# Patient Record
Sex: Female | Born: 1965
Health system: Southern US, Community
[De-identification: ages and names within clinical notes are randomized; demographics above are authoritative.]

## PROBLEM LIST (undated history)

## (undated) DIAGNOSIS — M2550 Pain in unspecified joint: Secondary | ICD-10-CM

## (undated) DIAGNOSIS — Z91018 Allergy to other foods: Secondary | ICD-10-CM

## (undated) DIAGNOSIS — I10 Essential (primary) hypertension: Secondary | ICD-10-CM

## (undated) DIAGNOSIS — E7800 Pure hypercholesterolemia, unspecified: Secondary | ICD-10-CM

## (undated) DIAGNOSIS — K5900 Constipation, unspecified: Secondary | ICD-10-CM

## (undated) DIAGNOSIS — D649 Anemia, unspecified: Secondary | ICD-10-CM

## (undated) DIAGNOSIS — M549 Dorsalgia, unspecified: Secondary | ICD-10-CM

## (undated) DIAGNOSIS — K589 Irritable bowel syndrome without diarrhea: Secondary | ICD-10-CM

## (undated) DIAGNOSIS — K219 Gastro-esophageal reflux disease without esophagitis: Secondary | ICD-10-CM

## (undated) DIAGNOSIS — J302 Other seasonal allergic rhinitis: Secondary | ICD-10-CM

## (undated) DIAGNOSIS — E669 Obesity, unspecified: Secondary | ICD-10-CM

## (undated) HISTORY — PX: WISDOM TOOTH EXTRACTION: SHX21

## (undated) HISTORY — DX: Pain in unspecified joint: M25.50

## (undated) HISTORY — DX: Constipation, unspecified: K59.00

## (undated) HISTORY — DX: Irritable bowel syndrome, unspecified: K58.9

## (undated) HISTORY — DX: Allergy to other foods: Z91.018

## (undated) HISTORY — PX: ESOPHAGOGASTRODUODENOSCOPY: SHX1529

## (undated) HISTORY — PX: COLONOSCOPY: SHX174

## (undated) HISTORY — DX: Dorsalgia, unspecified: M54.9

## (undated) HISTORY — DX: Obesity, unspecified: E66.9

## (undated) HISTORY — DX: Pure hypercholesterolemia, unspecified: E78.00

---

## 1999-07-01 ENCOUNTER — Emergency Department (HOSPITAL_COMMUNITY): Admission: EM | Admit: 1999-07-01 | Discharge: 1999-07-01 | Payer: Self-pay | Admitting: Emergency Medicine

## 1999-07-01 ENCOUNTER — Encounter: Payer: Self-pay | Admitting: Emergency Medicine

## 2000-03-22 ENCOUNTER — Other Ambulatory Visit: Admission: RE | Admit: 2000-03-22 | Discharge: 2000-03-22 | Payer: Self-pay | Admitting: Family Medicine

## 2001-01-12 ENCOUNTER — Encounter: Payer: Self-pay | Admitting: Gastroenterology

## 2001-01-12 ENCOUNTER — Ambulatory Visit (HOSPITAL_COMMUNITY): Admission: RE | Admit: 2001-01-12 | Discharge: 2001-01-12 | Payer: Self-pay | Admitting: Gastroenterology

## 2001-06-23 ENCOUNTER — Other Ambulatory Visit: Admission: RE | Admit: 2001-06-23 | Discharge: 2001-06-23 | Payer: Self-pay | Admitting: Internal Medicine

## 2001-06-27 ENCOUNTER — Encounter: Payer: Self-pay | Admitting: Internal Medicine

## 2001-06-27 ENCOUNTER — Encounter: Admission: RE | Admit: 2001-06-27 | Discharge: 2001-06-27 | Payer: Self-pay | Admitting: Internal Medicine

## 2002-04-09 ENCOUNTER — Emergency Department (HOSPITAL_COMMUNITY): Admission: EM | Admit: 2002-04-09 | Discharge: 2002-04-09 | Payer: Self-pay

## 2002-06-28 ENCOUNTER — Other Ambulatory Visit: Admission: RE | Admit: 2002-06-28 | Discharge: 2002-06-28 | Payer: Self-pay | Admitting: Internal Medicine

## 2003-03-11 ENCOUNTER — Emergency Department (HOSPITAL_COMMUNITY): Admission: EM | Admit: 2003-03-11 | Discharge: 2003-03-11 | Payer: Self-pay | Admitting: Emergency Medicine

## 2003-07-09 ENCOUNTER — Other Ambulatory Visit: Admission: RE | Admit: 2003-07-09 | Discharge: 2003-07-09 | Payer: Self-pay | Admitting: Internal Medicine

## 2003-08-29 ENCOUNTER — Ambulatory Visit (HOSPITAL_COMMUNITY): Admission: RE | Admit: 2003-08-29 | Discharge: 2003-08-29 | Payer: Self-pay | Admitting: Gastroenterology

## 2003-08-29 ENCOUNTER — Encounter (INDEPENDENT_AMBULATORY_CARE_PROVIDER_SITE_OTHER): Payer: Self-pay

## 2003-10-29 ENCOUNTER — Ambulatory Visit (HOSPITAL_COMMUNITY): Admission: RE | Admit: 2003-10-29 | Discharge: 2003-10-29 | Payer: Self-pay | Admitting: Gastroenterology

## 2003-11-06 ENCOUNTER — Ambulatory Visit (HOSPITAL_COMMUNITY): Admission: RE | Admit: 2003-11-06 | Discharge: 2003-11-06 | Payer: Self-pay | Admitting: Gastroenterology

## 2004-02-27 ENCOUNTER — Emergency Department (HOSPITAL_COMMUNITY): Admission: EM | Admit: 2004-02-27 | Discharge: 2004-02-27 | Payer: Self-pay

## 2004-08-20 ENCOUNTER — Encounter: Admission: RE | Admit: 2004-08-20 | Discharge: 2004-08-20 | Payer: Self-pay | Admitting: Internal Medicine

## 2004-12-25 ENCOUNTER — Emergency Department (HOSPITAL_COMMUNITY): Admission: EM | Admit: 2004-12-25 | Discharge: 2004-12-25 | Payer: Self-pay | Admitting: Family Medicine

## 2005-04-06 IMAGING — US US TRANSVAGINAL NON-OB
1 series · 14 of 25 positions shown · non-contrast
Comparison: none

CLINICAL DATA: Midcycle left pelvic pain.  Possible with ovulation. 
 TRANSABDOMINAL AND TRANSVAGINAL PELVIC ULTRASOUND

[Series 1: unknown · 0.25mm/px · 14 of 44 slices shown]
[im 1/44]
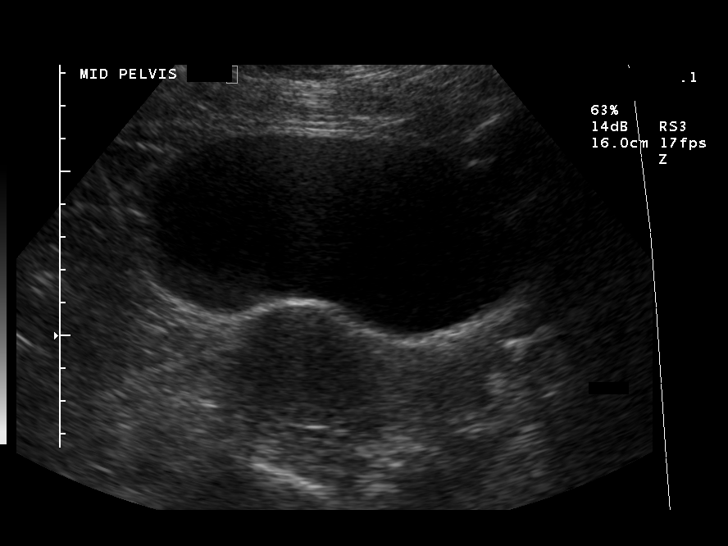
[im 4/44]
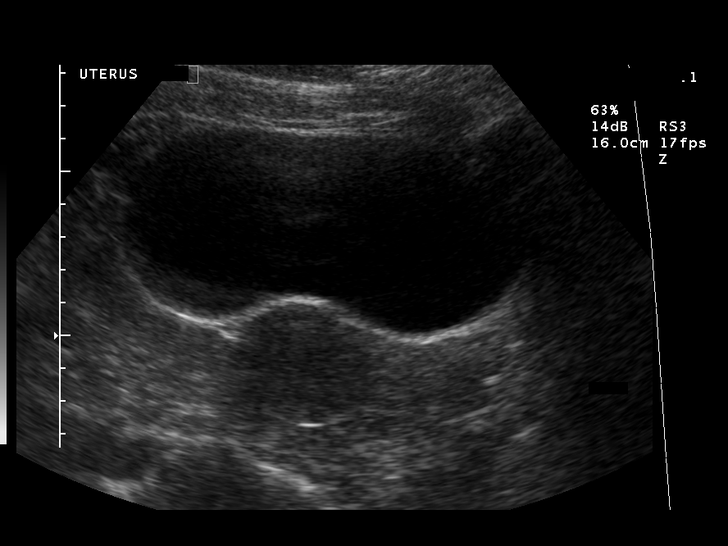
[im 8/44]
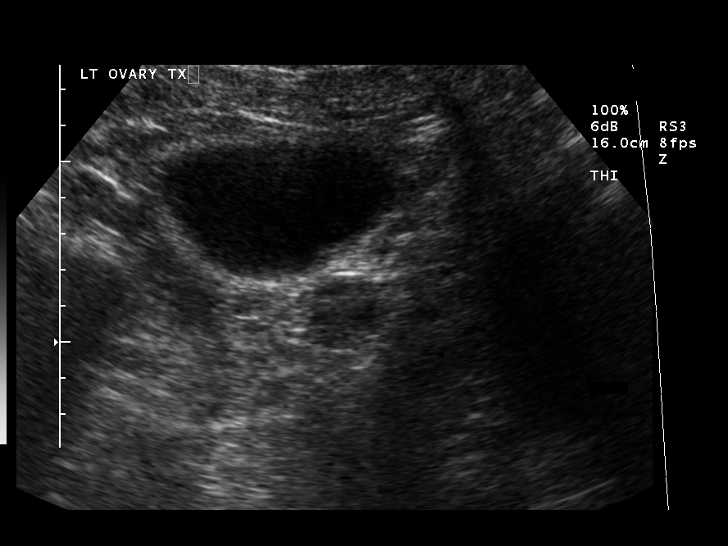
[im 11/44]
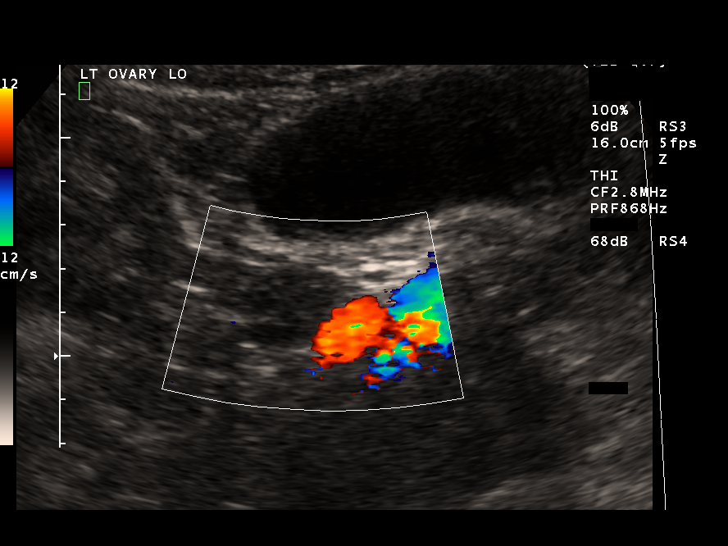
[im 15/44]
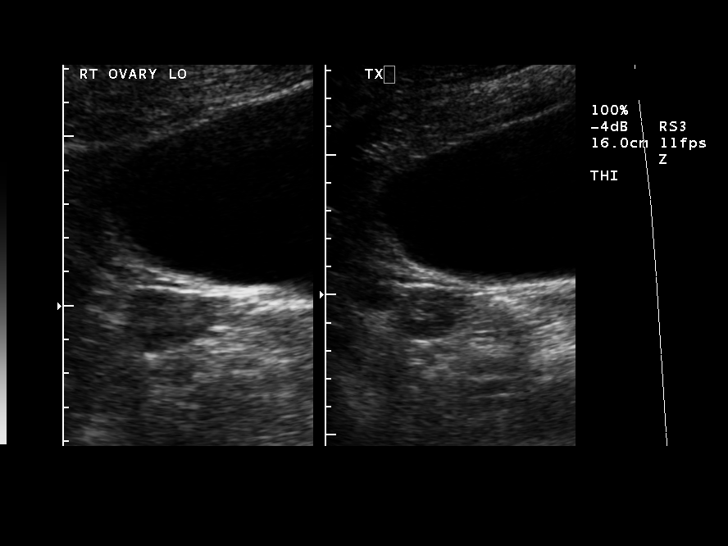
[im 17/44]
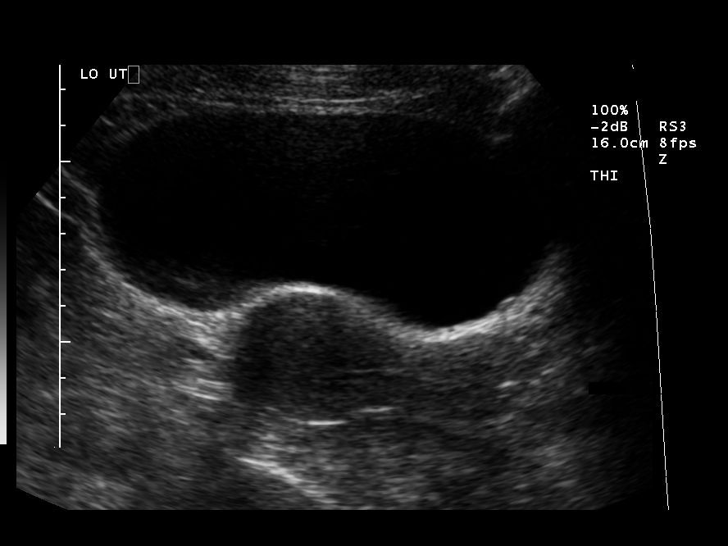
[im 20/44]
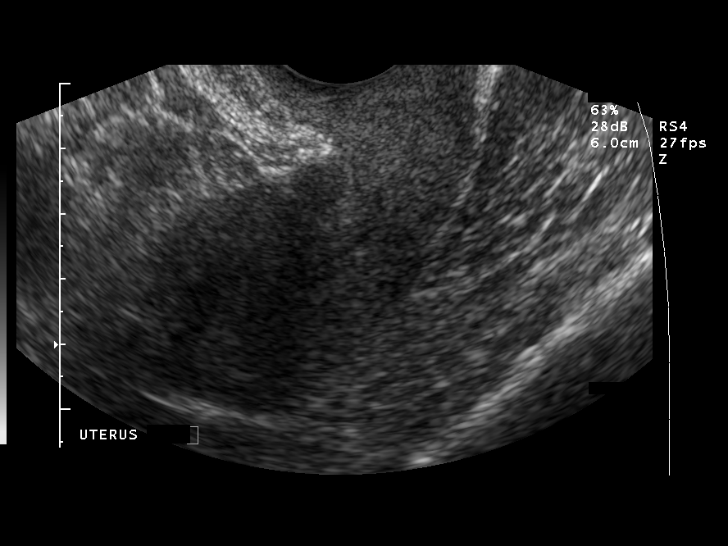
[im 24/44]
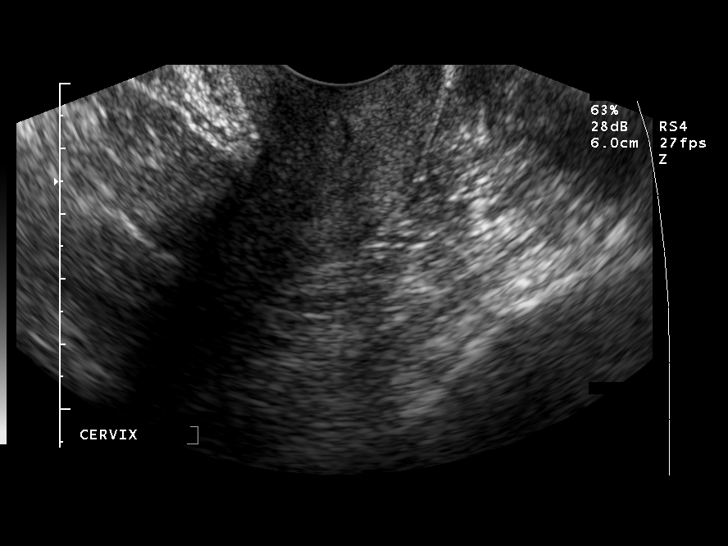
[im 27/44]
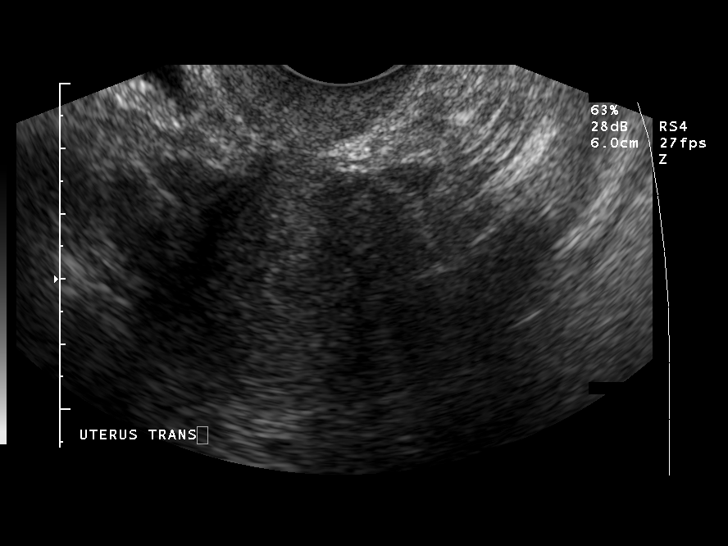
[im 29/44]
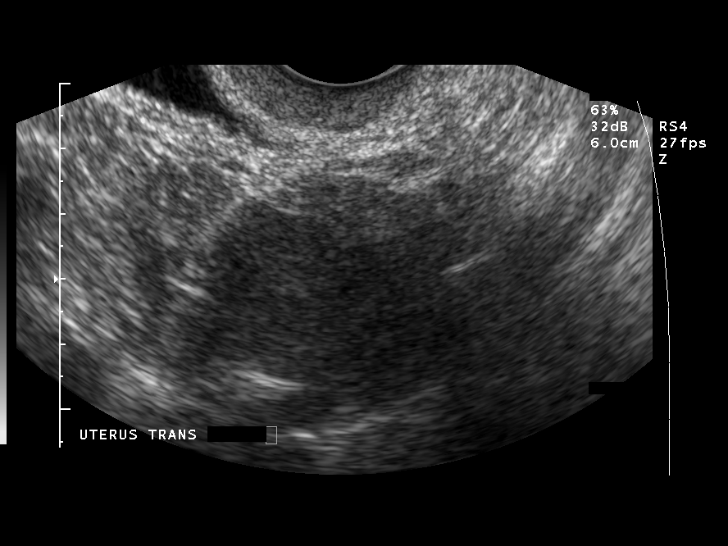
[im 33/44]
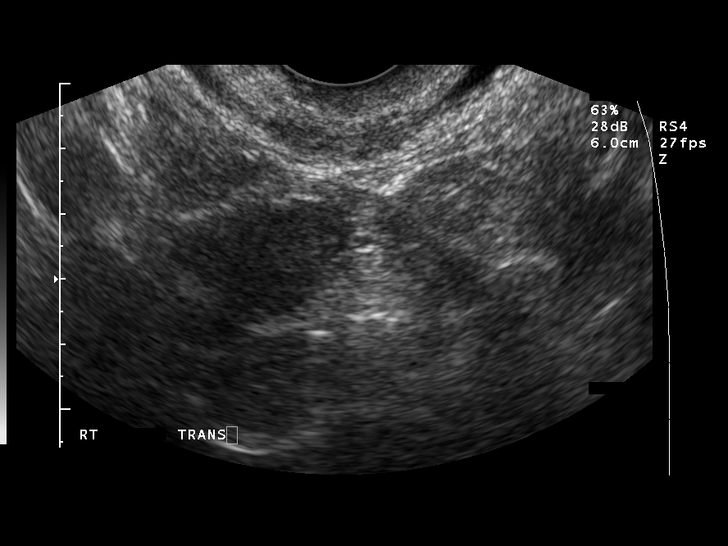
[im 36/44]
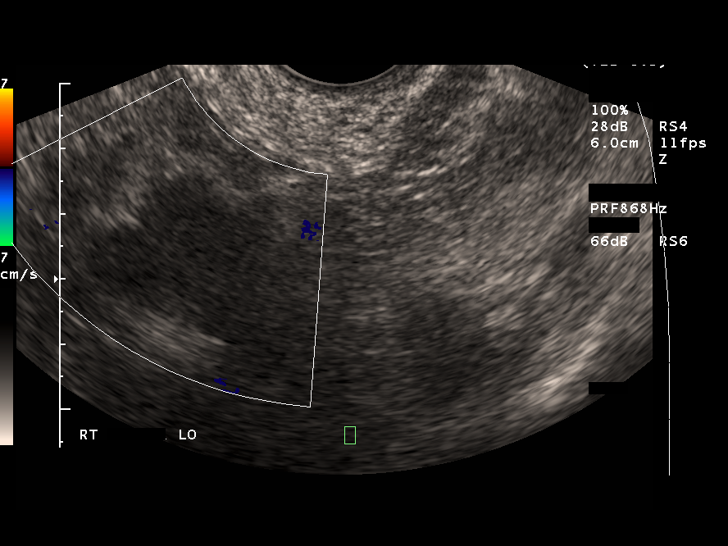
[im 40/44]
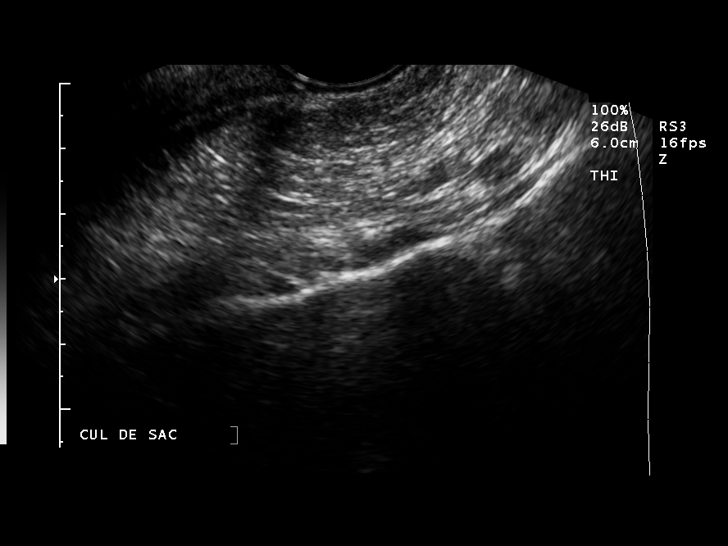
[im 44/44]
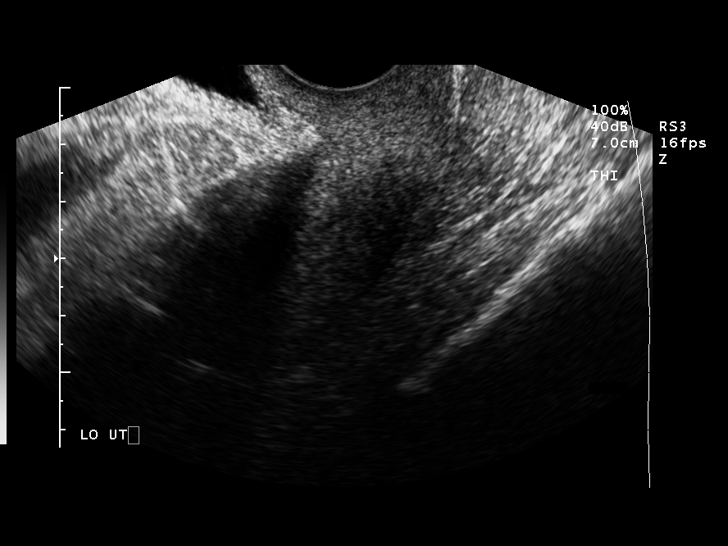

[14 of 25 positions shown; findings below may reference images not displayed]

FINDINGS: The study is slightly limited due to the patient?s body habitus and overlying gastrointestinal gas.  The uterus is normal in size measuring 8.2 cm long x 3.7 cm AP x 4.7 cm wide with slight limited assessment yet probably normal 4 mm fundal endometrial stripe thickness.  No free fluid is seen.  The left ovary is seen only transabdominally and is sonographically normal measuring 3.8 cm long x 2 cm AP x 2 cm wide.  The right ovary is normal in size also best seen transabdominally measuring 2.8 cm long x 1.8 cm AP x 2.6 cm wide with subcentimeter follicles.  
 IMPRESSION
 1.  Slight limitations due to overlying gastrointestinal gas and patient?s body habitus. 
 2.  Otherwise normal.

## 2005-09-07 ENCOUNTER — Ambulatory Visit: Payer: Self-pay | Admitting: Internal Medicine

## 2005-09-13 ENCOUNTER — Ambulatory Visit: Payer: Self-pay | Admitting: Internal Medicine

## 2005-09-14 ENCOUNTER — Other Ambulatory Visit: Admission: RE | Admit: 2005-09-14 | Discharge: 2005-09-14 | Payer: Self-pay | Admitting: Gynecology

## 2005-10-06 ENCOUNTER — Emergency Department (HOSPITAL_COMMUNITY): Admission: EM | Admit: 2005-10-06 | Discharge: 2005-10-06 | Payer: Self-pay | Admitting: Emergency Medicine

## 2006-09-12 ENCOUNTER — Ambulatory Visit: Payer: Self-pay | Admitting: Internal Medicine

## 2006-09-21 ENCOUNTER — Encounter: Payer: Self-pay | Admitting: Internal Medicine

## 2006-09-21 ENCOUNTER — Ambulatory Visit: Payer: Self-pay | Admitting: Internal Medicine

## 2006-09-21 ENCOUNTER — Other Ambulatory Visit: Admission: RE | Admit: 2006-09-21 | Discharge: 2006-09-21 | Payer: Self-pay | Admitting: Internal Medicine

## 2007-05-01 ENCOUNTER — Emergency Department (HOSPITAL_COMMUNITY): Admission: EM | Admit: 2007-05-01 | Discharge: 2007-05-01 | Payer: Self-pay | Admitting: Emergency Medicine

## 2007-11-22 ENCOUNTER — Ambulatory Visit: Payer: Self-pay | Admitting: Internal Medicine

## 2007-11-22 LAB — CONVERTED CEMR LAB
ALT: 18 units/L (ref 0–35)
AST: 19 units/L (ref 0–37)
Albumin: 3.8 g/dL (ref 3.5–5.2)
Alkaline Phosphatase: 39 units/L (ref 39–117)
BUN: 7 mg/dL (ref 6–23)
Basophils Absolute: 0 10*3/uL (ref 0.0–0.1)
Basophils Relative: 0.3 % (ref 0.0–1.0)
Bilirubin Urine: NEGATIVE
Bilirubin, Direct: 0.2 mg/dL (ref 0.0–0.3)
CO2: 27 meq/L (ref 19–32)
Calcium: 9 mg/dL (ref 8.4–10.5)
Chloride: 106 meq/L (ref 96–112)
Cholesterol: 175 mg/dL (ref 0–200)
Creatinine, Ser: 0.7 mg/dL (ref 0.4–1.2)
Crystals: NEGATIVE
Eosinophils Absolute: 0.1 10*3/uL (ref 0.0–0.6)
Eosinophils Relative: 2.3 % (ref 0.0–5.0)
GFR calc Af Amer: 117 mL/min
GFR calc non Af Amer: 97 mL/min
Glucose, Bld: 87 mg/dL (ref 70–99)
HCT: 37.7 % (ref 36.0–46.0)
HDL: 53.5 mg/dL (ref >39.0)
Hemoglobin, Urine: NEGATIVE
Hemoglobin: 12.7 g/dL (ref 12.0–15.0)
Ketones, ur: NEGATIVE mg/dL
LDL Cholesterol: 112 mg/dL — ABNORMAL HIGH (ref 0–99)
Lymphocytes Relative: 37.9 % (ref 12.0–46.0)
MCHC: 33.7 g/dL (ref 30.0–36.0)
MCV: 85.2 fL (ref 78.0–100.0)
Monocytes Absolute: 0.4 10*3/uL (ref 0.2–0.7)
Monocytes Relative: 8.5 % (ref 3.0–11.0)
Mucus, UA: NEGATIVE
Neutro Abs: 2.1 10*3/uL (ref 1.4–7.7)
Neutrophils Relative %: 51 % (ref 43.0–77.0)
Nitrite: NEGATIVE
Platelets: 182 10*3/uL (ref 150–400)
Potassium: 3.9 meq/L (ref 3.5–5.1)
RBC: 4.42 M/uL (ref 3.87–5.11)
RDW: 12.7 % (ref 11.5–14.6)
Sodium: 139 meq/L (ref 135–145)
Specific Gravity, Urine: 1.01 (ref 1.000–1.03)
TSH: 2.87 microintl units/mL (ref 0.35–5.50)
Total Bilirubin: 1 mg/dL (ref 0.3–1.2)
Total CHOL/HDL Ratio: 3.3
Total Protein, Urine: NEGATIVE mg/dL
Total Protein: 6.4 g/dL (ref 6.0–8.3)
Triglycerides: 49 mg/dL (ref 0–149)
Urine Glucose: NEGATIVE mg/dL
Urobilinogen, UA: 0.2 (ref 0.0–1.0)
VLDL: 10 mg/dL (ref 0–40)
WBC: 4.2 10*3/uL — ABNORMAL LOW (ref 4.5–10.5)
pH: 8 (ref 5.0–8.0)

## 2007-11-24 ENCOUNTER — Encounter: Payer: Self-pay | Admitting: Internal Medicine

## 2007-11-28 ENCOUNTER — Encounter: Payer: Self-pay | Admitting: Internal Medicine

## 2007-11-28 DIAGNOSIS — K589 Irritable bowel syndrome without diarrhea: Secondary | ICD-10-CM | POA: Insufficient documentation

## 2007-11-28 DIAGNOSIS — K219 Gastro-esophageal reflux disease without esophagitis: Secondary | ICD-10-CM

## 2007-11-28 DIAGNOSIS — R079 Chest pain, unspecified: Secondary | ICD-10-CM | POA: Insufficient documentation

## 2007-11-29 ENCOUNTER — Ambulatory Visit: Payer: Self-pay | Admitting: Internal Medicine

## 2008-09-11 ENCOUNTER — Encounter (INDEPENDENT_AMBULATORY_CARE_PROVIDER_SITE_OTHER): Payer: Self-pay | Admitting: *Deleted

## 2009-05-14 ENCOUNTER — Ambulatory Visit: Payer: Self-pay | Admitting: Internal Medicine

## 2009-05-14 LAB — CONVERTED CEMR LAB
ALT: 15 units/L (ref 0–35)
AST: 20 units/L (ref 0–37)
Albumin: 3.7 g/dL (ref 3.5–5.2)
Alkaline Phosphatase: 46 units/L (ref 39–117)
BUN: 12 mg/dL (ref 6–23)
Basophils Absolute: 0 10*3/uL (ref 0.0–0.1)
Basophils Relative: 0.7 % (ref 0.0–3.0)
Bilirubin Urine: NEGATIVE
Bilirubin, Direct: 0.1 mg/dL (ref 0.0–0.3)
CO2: 29 meq/L (ref 19–32)
Calcium: 8.7 mg/dL (ref 8.4–10.5)
Chloride: 110 meq/L (ref 96–112)
Cholesterol: 195 mg/dL (ref 0–200)
Creatinine, Ser: 0.7 mg/dL (ref 0.4–1.2)
Eosinophils Absolute: 0.2 10*3/uL (ref 0.0–0.7)
Eosinophils Relative: 3.6 % (ref 0.0–5.0)
GFR calc non Af Amer: 96.25 mL/min (ref >60)
Glucose, Bld: 66 mg/dL — ABNORMAL LOW (ref 70–99)
HCT: 38.5 % (ref 36.0–46.0)
HDL: 57.4 mg/dL (ref >39.00)
Hemoglobin, Urine: NEGATIVE
Hemoglobin: 12.9 g/dL (ref 12.0–15.0)
Ketones, ur: NEGATIVE mg/dL
LDL Cholesterol: 123 mg/dL — ABNORMAL HIGH (ref 0–99)
Leukocytes, UA: NEGATIVE
Lymphocytes Relative: 34 % (ref 12.0–46.0)
Lymphs Abs: 1.6 10*3/uL (ref 0.7–4.0)
MCHC: 33.5 g/dL (ref 30.0–36.0)
MCV: 86.3 fL (ref 78.0–100.0)
Monocytes Absolute: 0.4 10*3/uL (ref 0.1–1.0)
Monocytes Relative: 8 % (ref 3.0–12.0)
Neutro Abs: 2.6 10*3/uL (ref 1.4–7.7)
Neutrophils Relative %: 53.7 % (ref 43.0–77.0)
Nitrite: NEGATIVE
Platelets: 206 10*3/uL (ref 150.0–400.0)
Potassium: 4.1 meq/L (ref 3.5–5.1)
RBC: 4.46 M/uL (ref 3.87–5.11)
RDW: 13.3 % (ref 11.5–14.6)
Sodium: 143 meq/L (ref 135–145)
Specific Gravity, Urine: 1.01 (ref 1.000–1.030)
TSH: 2.47 microintl units/mL (ref 0.35–5.50)
Total Bilirubin: 0.8 mg/dL (ref 0.3–1.2)
Total CHOL/HDL Ratio: 3
Total Protein, Urine: NEGATIVE mg/dL
Total Protein: 6.5 g/dL (ref 6.0–8.3)
Triglycerides: 75 mg/dL (ref 0.0–149.0)
Urine Glucose: NEGATIVE mg/dL
Urobilinogen, UA: 0.2 (ref 0.0–1.0)
VLDL: 15 mg/dL (ref 0.0–40.0)
WBC: 4.8 10*3/uL (ref 4.5–10.5)
pH: 7.5 (ref 5.0–8.0)

## 2009-05-29 ENCOUNTER — Encounter: Payer: Self-pay | Admitting: Internal Medicine

## 2009-05-29 ENCOUNTER — Other Ambulatory Visit: Admission: RE | Admit: 2009-05-29 | Discharge: 2009-05-29 | Payer: Self-pay | Admitting: Internal Medicine

## 2009-05-29 ENCOUNTER — Ambulatory Visit: Payer: Self-pay | Admitting: Internal Medicine

## 2009-06-08 ENCOUNTER — Encounter: Payer: Self-pay | Admitting: Internal Medicine

## 2009-09-24 ENCOUNTER — Encounter: Payer: Self-pay | Admitting: Internal Medicine

## 2010-10-13 ENCOUNTER — Encounter: Payer: Self-pay | Admitting: Internal Medicine

## 2010-10-14 ENCOUNTER — Telehealth: Payer: Self-pay | Admitting: Internal Medicine

## 2011-01-19 NOTE — Progress Notes (Signed)
    Preventive Care Screening  Mammogram:    Date:  10/13/2010    Results:  normal bilateral  

## 2011-05-07 NOTE — Assessment & Plan Note (Signed)
[facility]                             PRIMARY CARE OFFICE NOTE   Allison Boyle, Allison Boyle                       MRN:          [id number]  DATE:09/21/2006                            DOB:          09/17/1966    Allison Boyle is a delightful 45 year old woman who presents for a followup  evaluation and wellness examination.  She was last seen in the office on  September 13, 2005; please see that dictation.  At that time, the patient  was having ongoing abdominal pain and pelvic pain with her menstrual cycle.  She had had a previous transvaginal ultrasound in 2005 which was  unremarkable.  The patient subsequently was seen by Dr. Nicholas Lose in late  September 2006.  He found no significant anatomic abnormality on  examination.  Ultrasound was difficult because of size and position of her  uterus.  She did have a mid-cycle CA125 which was evidently unremarkable.  The patient did have an FSH level, which was also by her report negative.  The patient reports she continues to have lower quadrant abdominal pain,  worse on the left with her menses, where she will have several bad days.  She now is taking Tylenol and using a hot water bottle with adequate relief,  although she has several bad days a month.  She continues to have some mild  menometrorrhagia.   The patient has had a significant increase in her LDL cholesterol; however,  she has been off medication and using lifestyle management.  She has been  following a low-fat diet and increasing her exercise.  She has been going to  [employer] and has had a 60-pound weight loss.   The patient has been followed by Dr. Victorino Dike for her IBS.  Fortunately,  she is currently stable.  Of note, her last upper endoscopy was in November  2004.   PAST MEDICAL HISTORY:   SURGICAL:  None.   TRAUMA:  None.   MEDICAL:  1. Usual childhood diseases.  2. Chest pain was negative for cardiac origin and workup.  3.  Reflux and IBS.  4. Menometrorrhagia with pain and discomfort.   CURRENT MEDICATIONS:  Multivitamins.  She uses over-the-counter medications  for an occasional dyspepsia.   HABITS:  Tobacco:  None, having quit many years ago.  Alcohol:  Averages 12  ounces of alcohol per weekend.   FAMILY HISTORY:  Positive for father with MI.  Mother had thyroid disease  and celiac sprue and osteoporosis.   SOCIAL HISTORY:  The patient is a native of [place].  She has been  married for 18 years.  She works in Public relations account executive at [employer].  She lives in [place].  She has 3 horses, 5 dogs, 2 cats, and  with her 2 horses being 47 years old and quite a bit of work.  She and her  husband own a piece of land.  He is engaged in rebuilding power cars.  The  patient reports that things are going well.   REVIEW OF SYSTEMS:  Negative for any constitutional problems.  It has been  more than 24 months since she has had an eye exam and does report some  decreased visual acuity in terms of focus.  No ENT, cardiovascular or  respiratory problems.  GI:  As noted above with over-the-counter medications  helping.  GU:  Stable.  MUSCULOSKELETAL:  Stable.   EXAMINATION:  Temperature was 97, blood pressure 128/82, pulse 66, weight  185, down from 243.  GENERAL APPEARANCE:  This is a well-nourished woman in no acute distress.  HEENT:  Normocephalic, atraumatic.  EACs and TMs were normal.  Oropharynx  with native dentition in good repair.  No buccal or palatal lesions were  noted.  Posterior pharynx was clear.  Conjunctivae and sclerae were clear.  PERRLA.  EOMI.  Funduscopic exam was unremarkable with no vascular  abnormality.  Disk margins were sharp; limitations were with a hand-held  instrument.  NECK:  Supple without thyromegaly.  NODES:  No adenopathy was noted in the cervical or supraclavicular regions.  CHEST:  No CVA tenderness.  Lungs were clear to auscultation and percussion.   BREASTS:  Breasts were pendulous.  Skin was normal.  The patient has  fibrocystic-type changes, no fixed mass lesion or abnormalities were noted.  There were no discharges from the nipples.  There was no axillary  adenopathy.  CARDIOVASCULAR:  Radial pulse 2+.  No JVD or carotid bruits.  She has a  quiet precordium with regular rate and rhythm without murmurs, rubs, or  gallops.  ABDOMEN:  Soft.  No guarding or rebound.  No organosplenomegaly was noted.  PELVIC:  __________ EG/BUS were normal.  There was a fair amount of mucus in  the posterior fornix.  Cervix appeared normal.  Pap scraping and  endocervical brushings were performed without difficulty.  EXTREMITIES:  Without clubbing, cyanosis, edema or deformity.  NEUROLOGIC:  Exam was nonfocal.  SKIN:  Clear.   LABORATORY DATA:  Hemoglobin was 11.2 g, white count was 4000 with a normal  differential.  Cholesterol was 176, triglycerides 84, HDL 38.2, LDL was 121.  Chemistries were unremarkable with a blood sugar of 93.  Electrolytes were  normal.  Kidney function and liver functions were normal.  TSH was normal at  2.37.  Urinalysis was normal.   ASSESSMENT AND PLAN:  1. Gynecological:  Patient with stable menometrorrhagia and dysmenorrhea.      She has been thoroughly evaluated with no underlying pathology      identified.  I plan no further workup at this time, but the patient to      continue to use symptomatic measures.  Her examination today was      unremarkable and she will be notified by phone of her Pap result.  2. Gastrointestinal:  The patient's irritable bowel syndrome and abdominal      discomfort are stable.  3. Lipids:  The patient has done a fantastic job of bringing down her LDL      cholesterol from 782 to 121 with lifestyle management alone.  I gave      her significant positive reinforcement and would have her continue her      present lifestyle management regimen. 4. Weight management:  The patient has done a  fantastic job with weight      loss, losing 60 pounds in the last 14 months.  We discussed a goal      weight for her and I have suggested a baseline body mass index table      weight of  170 pounds, which would put her at the low range of just      overweight; this would be a 15-pound weight loss in the next 12 months.      I would have her do this with her continued [employer] program.  5. Health maintenance:  The patient has been abstinent from smoking for 14      months.  I gave her significant positive reinforcement for this and in      particular, having not smoked, to continue to have lost weight is an      excellent achievement from a highly motivated patient.   The patient was asked to return to see me in 1 year or on an as-needed  basis.  I do have report of her mammograms from September 2007, which were  unremarkable.            ______________________________  Rosalyn Gess. Norins, MD      MEN/MedQ  DD:  09/21/2006  DT:  09/23/2006  Job #:  [id number]   cc:   [address] [place], [place] [address] Ms. Pearson Forster

## 2012-02-15 ENCOUNTER — Other Ambulatory Visit: Payer: Self-pay | Admitting: Family Medicine

## 2012-02-15 ENCOUNTER — Other Ambulatory Visit (HOSPITAL_COMMUNITY)
Admission: RE | Admit: 2012-02-15 | Discharge: 2012-02-15 | Disposition: A | Discharge status: Home / Self Care | Payer: 59 | Source: Ambulatory Visit | Attending: Family Medicine | Admitting: Family Medicine

## 2012-02-15 DIAGNOSIS — Z124 Encounter for screening for malignant neoplasm of cervix: Secondary | ICD-10-CM | POA: Insufficient documentation

## 2014-09-24 ENCOUNTER — Other Ambulatory Visit: Payer: Self-pay | Admitting: Gastroenterology

## 2014-12-09 ENCOUNTER — Ambulatory Visit (HOSPITAL_COMMUNITY)
Admission: RE | Admit: 2014-12-09 | Discharge: 2014-12-09 | Disposition: A | Discharge status: Home / Self Care | Payer: 59 | Source: Ambulatory Visit | Attending: Gastroenterology | Admitting: Gastroenterology

## 2014-12-09 ENCOUNTER — Other Ambulatory Visit (HOSPITAL_COMMUNITY): Payer: Self-pay | Admitting: Gastroenterology

## 2014-12-09 DIAGNOSIS — D649 Anemia, unspecified: Secondary | ICD-10-CM | POA: Diagnosis present

## 2014-12-09 DIAGNOSIS — I610 Nontraumatic intracerebral hemorrhage in hemisphere, subcortical: Secondary | ICD-10-CM

## 2014-12-09 DIAGNOSIS — K922 Gastrointestinal hemorrhage, unspecified: Secondary | ICD-10-CM | POA: Insufficient documentation

## 2015-03-04 ENCOUNTER — Other Ambulatory Visit (HOSPITAL_COMMUNITY)
Admission: RE | Admit: 2015-03-04 | Discharge: 2015-03-04 | Disposition: A | Discharge status: Home / Self Care | Payer: 59 | Source: Ambulatory Visit | Attending: Family Medicine | Admitting: Family Medicine

## 2015-03-04 ENCOUNTER — Other Ambulatory Visit: Payer: Self-pay | Admitting: Family Medicine

## 2015-03-04 DIAGNOSIS — Z01419 Encounter for gynecological examination (general) (routine) without abnormal findings: Secondary | ICD-10-CM | POA: Diagnosis not present

## 2015-03-06 LAB — CYTOLOGY - PAP

## 2015-03-20 ENCOUNTER — Other Ambulatory Visit: Payer: Self-pay | Admitting: Nurse Practitioner

## 2015-04-08 NOTE — Patient Instructions (Addendum)
   Your procedure is scheduled on: Thursday, April 28  Enter through the [employer] [facility] at: [place] up the phone at the desk and dial [phone] and inform us of your arrival.  Please call this number if you have any problems the morning of surgery: [phone]  Remember: Do not eat food after midnight: Wednesday Do not drink clear liquids after: 8:30 AM Thursday, day of surgery Take these medicines the morning of surgery with a SIP OF WATER:  Protonix and hctz   Do not wear jewelry, make-up, or FINGER nail polish No metal in your hair or on your body. Do not wear lotions, powders, perfumes.  You may wear deodorant.  Do not bring valuables to the hospital. Contacts, dentures or bridgework may not be worn into surgery.  Patients discharged on the day of surgery will not be allowed to drive home.  Home with husband Remo Lipps cell [phone]

## 2015-04-08 NOTE — H&P (Signed)
H&P 49yo G0 who presents for Hysteroscopy, D&C, HTA ablation due to heavy menstrual bleeding.  In review,  She reports heavy menstrual bleeding with menses every month lasting 5-7 days with 2-4 days of heavy bleeding. During the heavy days she will have to use both a super tampon and pad and change it every 2 hours. She also reports passage of large clots, sometimes plum-sized or bigger. +Dysmenorrhea and bloating during her period. No intermenstrual bleeding. Of note, she has significant anemia due to her bleeding with low iron studies and Hgb 9.8.  An EMB and TVUS were performed with benign findings (see below).  Management options were reviewed and pt wishes to proceed with surgical management.      ROS:  CONSTITUTIONAL:  no Chills. no Fever. no Skin rash.  HEENT:  Blurrred vision no.  CARDIOLOGY:  no Chest pain.  RESPIRATORY:  no Shortness of breath. no Cough.  GASTROENTEROLOGY:  no Abdominal pain. no Appetite change. no Change in bowel movements.  UROLOGY:  no Urinary frequency. no Urinary incontinence. no Urinary urgency.  FEMALE REPRODUCTIVE:  no Breast lumps or discharge. no Breast pain.  NEUROLOGY:  no Dizziness. no Headache.         Medical History: Hypertension, GERD, Heavy menstrual bleeding, h/o MVP diagnosis, later cleared with echo - no need for dental prophylaxis, chronic iron deficiency anemia (probably menstrual); Hemoccult negative, EGD/colonoscopy normal 09/2014; capsule endoscopy nl 11/2014, Posion ivy - hives, Food allergies-hives, Anemia.        Gyn History:  Sexual activity currently sexually active.  Periods : every 28 days, regular.  LMP 03/05/2015.  Birth control condoms.  Menarche 11.        OB History:  Never been pregnant per patient.        Surgical History: wisdom teeth extraction , colonoscopy 09/24/2014, endo 09/24/2014, capsule endo 11/25/2014 .        Hospitalization/Major Diagnostic Procedure: Broken Pelvic 07/1989's.        Family  History: Father: deceased, CAD, CVA, diagnosed with HTN, CVA, CADMother: alive, osteoporosis, goiter, h/o oophorectomy, diagnosed with HTNPaternal Grand Father: deceased, MIPaternal Grand Mother: deceasedMaternal [place] Father: deceasedMaternal Grand Mother: deceased, diagnosed with CVABrother 1: alive, MISister 1: alive, goiterPaternal aunt: deceased, Kidney problemsMaternal aunt: deceased, Kidney problems Neg family history for colon cancer/polyps and liver Dz.       Social History:  General:  Tobacco use  cigarettes: Former smoker Quit in year 2005 Tobacco history last updated 03/20/2015 no Smoking, in the past.  Alcohol: yes, beer, occasionally.  Caffeine: yes, very little.  no Recreational drug use.  Occupation: employed, Doctor, general practice.  Marital Status: married.  Children: none.        Medications: Taking Multivitamin . Tablet 1 tablet Once daily, Taking Poly Iron PN(Prenatal Vit-Fe Psac Cmplx-FA) 150 mg 1 capsule once a day, Taking Protonix(Pantoprazole Sodium) 40 MG Tablet Delayed Release 1 tablet as needed, Taking Fish Oil 1000 MG Capsule 1 capsule with a meal Once a day, Taking Hydrochlorothiazide 12.5 MG Tablet 1 tablet Once a day, Medication List reviewed and reconciled with the patient       Allergies: Polyethylene Glycol: rash: Allergy, strawberries: severe hives: Allergy.    O:  GENERAL APPEARANCE alert, oriented, NAD, pleasant.  SKIN: normal, no rash.  LUNGS: clear to auscultation bilaterally, no wheezes, rhonchi, rales.  HEART: no murmurs, regular rate and rhythm.  ABDOMEN: obese, no masses palpated- possible enlarged uterus, but difficult to palpate due to obesity, soft and not tender,  no rebound, no guarding.  FEMALE GENITOURINARY: No external lesions, Vagina - pink moist mucosa, no lesions or abnormal discharge, cervix - closed, no discharge or lesions. No CMT. No adnexal masses bilaterally. Uterus: nontender and normal size on palpation.  EXTREMITIES: no edema  present, no calf tenderness bilaterally.    Labs:  EMB (03/20/15): small fragments of benign endometrial polyp, nonpolypoid proliferative endometrium, no atypia or malignancy  TVUS (03/26/15): Normal sized anteverted uterus measuring 7.4x4.7x3.9cm, no uterine anomalies. Thickened endometrium, no discrete mass seen. Right ovary with simple follicle <3ZS and left ovary within normal limits.    A/P: 49yo female who presents for hysteroscopy, D&C, HTA ablation due to heavy menstrual bleeding -NPO -LR @ 125cc/hr -SCDs to OR -Risk, benefit and indication reviewed with patient including risk of bleeding, cramping and potential failure of the procedure.  Questions and concerns were addressed and pt wishes to proceed with procedure.  [name], DO [phone] (pager) [phone] (office)

## 2015-04-09 ENCOUNTER — Encounter (HOSPITAL_COMMUNITY)
Admission: RE | Admit: 2015-04-09 | Discharge: 2015-04-09 | Disposition: A | Discharge status: Home / Self Care | Payer: 59 | Source: Ambulatory Visit | Attending: Obstetrics & Gynecology | Admitting: Obstetrics & Gynecology

## 2015-04-09 ENCOUNTER — Encounter (HOSPITAL_COMMUNITY): Payer: Self-pay

## 2015-04-09 ENCOUNTER — Other Ambulatory Visit: Payer: Self-pay

## 2015-04-09 DIAGNOSIS — Z01818 Encounter for other preprocedural examination: Secondary | ICD-10-CM | POA: Insufficient documentation

## 2015-04-09 HISTORY — DX: Other seasonal allergic rhinitis: J30.2

## 2015-04-09 HISTORY — DX: Gastro-esophageal reflux disease without esophagitis: K21.9

## 2015-04-09 HISTORY — DX: Essential (primary) hypertension: I10

## 2015-04-09 HISTORY — DX: Anemia, unspecified: D64.9

## 2015-04-09 LAB — BASIC METABOLIC PANEL
ANION GAP: 7 (ref 5–15)
BUN: 16 mg/dL (ref 6–23)
CO2: 25 mmol/L (ref 19–32)
Calcium: 8.2 mg/dL — ABNORMAL LOW (ref 8.4–10.5)
Chloride: 104 mmol/L (ref 96–112)
Creatinine, Ser: 0.76 mg/dL (ref 0.50–1.10)
GFR calc Af Amer: >90 mL/min (ref >90)
GFR calc non Af Amer: >90 mL/min (ref >90)
Glucose, Bld: 138 mg/dL — ABNORMAL HIGH (ref 70–99)
Potassium: 3.3 mmol/L — ABNORMAL LOW (ref 3.5–5.1)
Sodium: 136 mmol/L (ref 135–145)

## 2015-04-09 LAB — CBC WITH DIFFERENTIAL/PLATELET
Basophils Absolute: 0 10*3/uL (ref 0.0–0.1)
Basophils Relative: 1 % (ref 0–1)
Eosinophils Absolute: 0.3 10*3/uL (ref 0.0–0.7)
Eosinophils Relative: 5 % (ref 0–5)
HCT: 33.1 % — ABNORMAL LOW (ref 36.0–46.0)
Hemoglobin: 10 g/dL — ABNORMAL LOW (ref 12.0–15.0)
Lymphocytes Relative: 30 % (ref 12–46)
Lymphs Abs: 1.8 10*3/uL (ref 0.7–4.0)
MCH: 22.9 pg — ABNORMAL LOW (ref 26.0–34.0)
MCHC: 30.2 g/dL (ref 30.0–36.0)
MCV: 75.7 fL — ABNORMAL LOW (ref 78.0–100.0)
Monocytes Absolute: 0.4 10*3/uL (ref 0.1–1.0)
Monocytes Relative: 6 % (ref 3–12)
Neutro Abs: 3.5 10*3/uL (ref 1.7–7.7)
Neutrophils Relative %: 58 % (ref 43–77)
Platelets: 243 10*3/uL (ref 150–400)
RBC: 4.37 MIL/uL (ref 3.87–5.11)
RDW: 18.3 % — AB (ref 11.5–15.5)
WBC: 6.1 10*3/uL (ref 4.0–10.5)

## 2015-04-16 NOTE — Anesthesia Preprocedure Evaluation (Addendum)
Anesthesia Evaluation  Patient identified by MRN, date of birth, ID band Patient awake    Reviewed: Allergy & Precautions, NPO status , Patient's Chart, lab work & pertinent test results  History of Anesthesia Complications Negative for: history of anesthetic complications  Airway Mallampati: II  TM Distance: >3 FB Neck ROM: Full    Dental no notable dental hx. (+) Dental Advisory Given   Pulmonary former smoker,  breath sounds clear to auscultation  Pulmonary exam normal       Cardiovascular hypertension, Pt. on medications Rhythm:Regular Rate:Normal     Neuro/Psych negative neurological ROS  negative psych ROS   GI/Hepatic Neg liver ROS, GERD-  Medicated and Controlled,IBS   Endo/Other  Morbid obesity  Renal/GU negative Renal ROS  negative genitourinary   Musculoskeletal negative musculoskeletal ROS (+)   Abdominal (+) + obese,   Peds negative pediatric ROS (+)  Hematology  (+) anemia ,   Anesthesia Other Findings   Reproductive/Obstetrics negative OB ROS                           Anesthesia Physical Anesthesia Plan  ASA: III  Anesthesia Plan: General   Post-op Pain Management:    Induction: Intravenous  Airway Management Planned: LMA  Additional Equipment:   Intra-op Plan:   Post-operative Plan: Extubation in OR  Informed Consent: I have reviewed the patients History and Physical, chart, labs and discussed the procedure including the risks, benefits and alternatives for the proposed anesthesia with the patient or authorized representative who has indicated his/her understanding and acceptance.   Dental advisory given  Plan Discussed with: CRNA, Anesthesiologist and Surgeon  Anesthesia Plan Comments:        Anesthesia Quick Evaluation

## 2015-04-17 ENCOUNTER — Ambulatory Visit (HOSPITAL_COMMUNITY): Payer: 59 | Admitting: Anesthesiology

## 2015-04-17 ENCOUNTER — Encounter (HOSPITAL_COMMUNITY)
Admission: RE | Disposition: A | Discharge status: Home / Self Care | Payer: Self-pay | Source: Ambulatory Visit | Attending: Obstetrics & Gynecology

## 2015-04-17 ENCOUNTER — Ambulatory Visit (HOSPITAL_COMMUNITY)
Admission: RE | Admit: 2015-04-17 | Discharge: 2015-04-17 | Disposition: A | Discharge status: Home / Self Care | Payer: 59 | Source: Ambulatory Visit | Attending: Obstetrics & Gynecology | Admitting: Obstetrics & Gynecology

## 2015-04-17 ENCOUNTER — Encounter (HOSPITAL_COMMUNITY): Payer: Self-pay | Admitting: Anesthesiology

## 2015-04-17 DIAGNOSIS — Z6841 Body Mass Index (BMI) 40.0 and over, adult: Secondary | ICD-10-CM | POA: Insufficient documentation

## 2015-04-17 DIAGNOSIS — D649 Anemia, unspecified: Secondary | ICD-10-CM | POA: Insufficient documentation

## 2015-04-17 DIAGNOSIS — I10 Essential (primary) hypertension: Secondary | ICD-10-CM | POA: Insufficient documentation

## 2015-04-17 DIAGNOSIS — K219 Gastro-esophageal reflux disease without esophagitis: Secondary | ICD-10-CM | POA: Insufficient documentation

## 2015-04-17 DIAGNOSIS — Z87891 Personal history of nicotine dependence: Secondary | ICD-10-CM | POA: Diagnosis not present

## 2015-04-17 DIAGNOSIS — I341 Nonrheumatic mitral (valve) prolapse: Secondary | ICD-10-CM | POA: Insufficient documentation

## 2015-04-17 DIAGNOSIS — N939 Abnormal uterine and vaginal bleeding, unspecified: Secondary | ICD-10-CM | POA: Insufficient documentation

## 2015-04-17 DIAGNOSIS — Z91018 Allergy to other foods: Secondary | ICD-10-CM | POA: Diagnosis not present

## 2015-04-17 HISTORY — PX: DILITATION & CURRETTAGE/HYSTROSCOPY WITH HYDROTHERMAL ABLATION: SHX5570

## 2015-04-17 LAB — PREGNANCY, URINE: PREG TEST UR: NEGATIVE

## 2015-04-17 SURGERY — DILATATION & CURETTAGE/HYSTEROSCOPY WITH HYDROTHERMAL ABLATION
Anesthesia: General | Site: Vagina

## 2015-04-17 MED ORDER — ONDANSETRON HCL 4 MG/2ML IJ SOLN: 4.0000 mg | Freq: Once | INTRAMUSCULAR | Status: DC | PRN

## 2015-04-17 MED ORDER — HYDROMORPHONE HCL 1 MG/ML IJ SOLN
INTRAMUSCULAR | Status: DC | PRN
Start: 2015-04-17 — End: 2015-04-17
  Administered 2015-04-17: 0.5 mg via INTRAVENOUS

## 2015-04-17 MED ORDER — FENTANYL CITRATE (PF) 100 MCG/2ML IJ SOLN: 25.0000 ug | INTRAMUSCULAR | Status: DC | PRN

## 2015-04-17 MED ORDER — FENTANYL CITRATE (PF) 100 MCG/2ML IJ SOLN
INTRAMUSCULAR | Status: DC | PRN
  Administered 2015-04-17: 50 ug via INTRAVENOUS
  Administered 2015-04-17: 100 ug via INTRAVENOUS
  Administered 2015-04-17: 50 ug via INTRAVENOUS

## 2015-04-17 MED ORDER — DEXAMETHASONE SODIUM PHOSPHATE 4 MG/ML IJ SOLN
INTRAMUSCULAR | Status: AC
  Filled 2015-04-17: qty 1

## 2015-04-17 MED ORDER — SCOPOLAMINE 1 MG/3DAYS TD PT72
1.0000 | MEDICATED_PATCH | Freq: Once | TRANSDERMAL | Status: DC
  Administered 2015-04-17: 1.5 mg via TRANSDERMAL

## 2015-04-17 MED ORDER — ACETAMINOPHEN 500 MG PO TABS
ORAL_TABLET | ORAL | Status: AC
  Filled 2015-04-17: qty 2

## 2015-04-17 MED ORDER — MIDAZOLAM HCL 2 MG/2ML IJ SOLN
INTRAMUSCULAR | Status: DC | PRN
  Administered 2015-04-17: 2 mg via INTRAVENOUS

## 2015-04-17 MED ORDER — KETOROLAC TROMETHAMINE 30 MG/ML IJ SOLN
INTRAMUSCULAR | Status: DC | PRN
  Administered 2015-04-17: 30 mg via INTRAVENOUS

## 2015-04-17 MED ORDER — KETOROLAC TROMETHAMINE 30 MG/ML IJ SOLN
INTRAMUSCULAR | Status: AC
  Filled 2015-04-17: qty 1

## 2015-04-17 MED ORDER — SCOPOLAMINE 1 MG/3DAYS TD PT72
MEDICATED_PATCH | TRANSDERMAL | Status: AC
  Administered 2015-04-17: 1.5 mg via TRANSDERMAL
  Filled 2015-04-17: qty 1

## 2015-04-17 MED ORDER — LIDOCAINE HCL (CARDIAC) 20 MG/ML IV SOLN
INTRAVENOUS | Status: DC | PRN
  Administered 2015-04-17: 80 mg via INTRAVENOUS

## 2015-04-17 MED ORDER — GLYCOPYRROLATE 0.2 MG/ML IJ SOLN
INTRAMUSCULAR | Status: DC | PRN
  Administered 2015-04-17: 0.1 mg via INTRAVENOUS

## 2015-04-17 MED ORDER — FENTANYL CITRATE (PF) 100 MCG/2ML IJ SOLN
INTRAMUSCULAR | Status: AC
  Filled 2015-04-17: qty 2

## 2015-04-17 MED ORDER — HYDROMORPHONE HCL 1 MG/ML IJ SOLN
INTRAMUSCULAR | Status: AC
  Filled 2015-04-17: qty 1

## 2015-04-17 MED ORDER — PROPOFOL 10 MG/ML IV BOLUS
INTRAVENOUS | Status: DC | PRN
  Administered 2015-04-17: 20 mg via INTRAVENOUS
  Administered 2015-04-17: 150 mg via INTRAVENOUS

## 2015-04-17 MED ORDER — SCOPOLAMINE 1 MG/3DAYS TD PT72
MEDICATED_PATCH | TRANSDERMAL | Status: AC
  Filled 2015-04-17: qty 1

## 2015-04-17 MED ORDER — MIDAZOLAM HCL 2 MG/2ML IJ SOLN
INTRAMUSCULAR | Status: AC
  Filled 2015-04-17: qty 2

## 2015-04-17 MED ORDER — LACTATED RINGERS IV SOLN: INTRAVENOUS | Status: DC

## 2015-04-17 MED ORDER — LACTATED RINGERS IV SOLN
INTRAVENOUS | Status: DC
  Administered 2015-04-17 (×2): via INTRAVENOUS

## 2015-04-17 MED ORDER — ACETAMINOPHEN 500 MG PO TABS
1000.0000 mg | ORAL_TABLET | Freq: Once | ORAL | Status: AC
  Administered 2015-04-17: 1000 mg via ORAL

## 2015-04-17 MED ORDER — DEXAMETHASONE SODIUM PHOSPHATE 10 MG/ML IJ SOLN
INTRAMUSCULAR | Status: DC | PRN
  Administered 2015-04-17: 4 mg via INTRAVENOUS

## 2015-04-17 MED ORDER — ONDANSETRON HCL 4 MG/2ML IJ SOLN
INTRAMUSCULAR | Status: AC
  Filled 2015-04-17: qty 2

## 2015-04-17 MED ORDER — ONDANSETRON HCL 4 MG/2ML IJ SOLN
INTRAMUSCULAR | Status: DC | PRN
  Administered 2015-04-17: 4 mg via INTRAVENOUS

## 2015-04-17 SURGICAL SUPPLY — 13 items
CANISTER SUCT 3000ML (MISCELLANEOUS) ×3 IMPLANT
CATH ROBINSON RED A/P 16FR (CATHETERS) ×3 IMPLANT
CLOTH BEACON ORANGE TIMEOUT ST (SAFETY) ×3 IMPLANT
CONTAINER PREFILL 10% NBF 60ML (FORM) ×6 IMPLANT
DILATOR CANAL MILEX (MISCELLANEOUS) IMPLANT
GLOVE BIOGEL PI IND STRL 6.5 (GLOVE) ×2 IMPLANT
GLOVE BIOGEL PI INDICATOR 6.5 (GLOVE) ×4
GLOVE ECLIPSE 6.5 STRL STRAW (GLOVE) ×3 IMPLANT
GOWN STRL REUS W/TWL LRG LVL3 (GOWN DISPOSABLE) ×6 IMPLANT
PACK VAGINAL MINOR WOMEN LF (CUSTOM PROCEDURE TRAY) ×3 IMPLANT
PAD OB MATERNITY 4.3X12.25 (PERSONAL CARE ITEMS) ×3 IMPLANT
SET GENESYS HTA PROCERVA (MISCELLANEOUS) ×3 IMPLANT
TOWEL OR 17X24 6PK STRL BLUE (TOWEL DISPOSABLE) ×6 IMPLANT

## 2015-04-17 NOTE — Op Note (Signed)
Operative Report  PreOp: Abnormal uterine bleeding PostOp: same Procedure:  Hysteroscopy, Dilation and Curettage, Endometrial ablation Surgeon: Dr. [name] Anesthesia: General Complications:none EBL: 5cc UOP: 30cc IVF:1000cc  Findings:9wk sized anteverted uterus with proliferative polypoid-like endometrial tissue, ostia visualized bilaterally  Specimens: 1) endometrial curettings  Procedure: The patient was taken to the operating room where she underwent general anesthesia without difficulty. The patient was placed in a low lithotomy position using Allen stirrups. She was prepped and draped in the normal sterile fashion. The bladder was drained using a red rubber urethral catheter. A sterile speculum was inserted into the vagina. A single tooth tenaculum was placed on the anterior lip of the cervix. The uterus was then sounded to 9cm. The endocervical canal was then serially dilated to 18French using Hank dilators.  The diagnostic hysteroscope was then inserted without difficulty and noted to have the findings as listed above. Visualization was achieved using NS as a distending medium. The HTA system was then activated using manufacturer's protocol and utilized without difficulty or complications.  Visualization was maintained throughout the procedure.  The device was then removed and sharp curettage was performed.  The hysteroscope was reinserted- global ablation without perforation was noted.  All instrument were then removed. Hemostasis was observed at the cervical site. The patient was repositioned to the supine position. The patient tolerated the procedure without any complications and taken to recovery in stable condition.   [name], DO [phone] (pager) [phone] (office)

## 2015-04-17 NOTE — Discharge Instructions (Signed)
HOME INSTRUCTIONS  Please note any unusual or excessive bleeding, pain, swelling. Mild dizziness or drowsiness are normal for about 24 hours after surgery.   Shower when comfortable  Restrictions: No driving for 24 hours or while taking pain medications.  Activity:  No heavy lifting (> 10 lbs), nothing in vagina (no tampons, douching, or intercourse) x 2 weeks; no tub baths for 2 weeks Vaginal spotting is expected but if your bleeding is heavy, period like,  please call the office  Diet:  You may eat whatever you want.  Do not eat large meals.  Eat small frequent meals throughout the day.  Continue to drink a good amount of water at least 6-8 glasses of water per day, hydration is very important for the healing process.  Pain Management: Take Motrin and/or Tylenol as prescribed/needed for pain.  Always take prescription pain medication with food, it may cause constipation, increase fluids and fiber and you may want to take an over-the-counter stool softener like Colace as needed up to 2x a day.    Alcohol -- Avoid for 24 hours and while taking pain medications.  Nausea: Take sips of ginger ale or soda  Fever -- Call physician if temperature over 101 degrees  Follow up:  If you do not already have a follow up appointment scheduled, please call the office at [phone].  If you experience fever (a temperature greater than 100.4), pain unrelieved by pain medication, shortness of breath, swelling of a single leg, or any other symptoms which are concerning to you please the office immediately.

## 2015-04-17 NOTE — Interval H&P Note (Signed)
History and Physical Interval Note:  04/17/2015 12:07 PM  Allison Boyle  has presented today for surgery, with the diagnosis of N93.9  Abnormal Uterine Bleeding  The various methods of treatment have been discussed with the patient and family. After consideration of risks, benefits and other options for treatment, the patient has consented to  Procedure(s): DILATATION & CURETTAGE/HYSTEROSCOPY WITH HYDROTHERMAL ABLATION (N/A) as a surgical intervention .  The patient's history has been reviewed, patient examined, no change in status, stable for surgery.  I have reviewed the patient's chart and labs.  Questions were answered to the patient's satisfaction.     [name], M

## 2015-04-17 NOTE — Anesthesia Procedure Notes (Signed)
Procedure Name: LMA Insertion Date/Time: 04/17/2015 12:23 PM Performed by: Flossie Dibble Pre-anesthesia Checklist: Patient identified, Patient being monitored, Timeout performed, Emergency Drugs available and Suction available Patient Re-evaluated:Patient Re-evaluated prior to inductionOxygen Delivery Method: Circle system utilized Preoxygenation: Pre-oxygenation with 100% oxygen Intubation Type: IV induction LMA: LMA inserted LMA Size: 4.0 Number of attempts: 1 Placement Confirmation: breath sounds checked- equal and bilateral and positive ETCO2 Tube secured with: Tape Dental Injury: Teeth and Oropharynx as per pre-operative assessment

## 2015-04-17 NOTE — Anesthesia Postprocedure Evaluation (Signed)
  Anesthesia Post-op Note  Patient: Allison Boyle  Procedure(s) Performed: Procedure(s): DILATATION & CURETTAGE/HYSTEROSCOPY WITH HYDROTHERMAL ABLATION (N/A)  Patient Location: PACU  Anesthesia Type:General  Level of Consciousness: awake, alert  and oriented  Airway and Oxygen Therapy: Patient Spontanous Breathing  Post-op Pain: mild  Post-op Assessment: Post-op Vital signs reviewed, Patient's Cardiovascular Status Stable, Respiratory Function Stable, Patent Airway, No signs of Nausea or vomiting and Pain level controlled  Post-op Vital Signs: Reviewed and stable  Last Vitals:  Filed Vitals:   04/17/15 1415  BP:   Pulse: 63  Temp: 36.4 C  Resp: 16    Complications: No apparent anesthesia complications

## 2015-04-17 NOTE — Transfer of Care (Signed)
Immediate Anesthesia Transfer of Care Note  Patient: Allison Boyle  Procedure(s) Performed: Procedure(s): DILATATION & CURETTAGE/HYSTEROSCOPY WITH HYDROTHERMAL ABLATION (N/A)  Patient Location: PACU  Anesthesia Type:General  Level of Consciousness: awake, alert  and oriented  Airway & Oxygen Therapy: Patient Spontanous Breathing and Patient connected to nasal cannula oxygen  Post-op Assessment: Report given to RN and Post -op Vital signs reviewed and stable  Post vital signs: Reviewed and stable  Last Vitals:  Filed Vitals:   04/17/15 1109  BP: 136/80  Pulse: 88  Temp: 36.3 C  Resp: 18    Complications: No apparent anesthesia complications

## 2015-04-19 ENCOUNTER — Encounter (HOSPITAL_COMMUNITY): Payer: Self-pay | Admitting: Obstetrics & Gynecology

## 2015-07-26 IMAGING — DX DG ABDOMEN 1V
1 series · 1 of 1 positions shown · non-contrast
Comparison: CT with 11/06/2003

CLINICAL DATA: Anemia.  Gastrointestinal bleeding.

EXAM:
ABDOMEN - 1 VIEW

[abdomen supine]
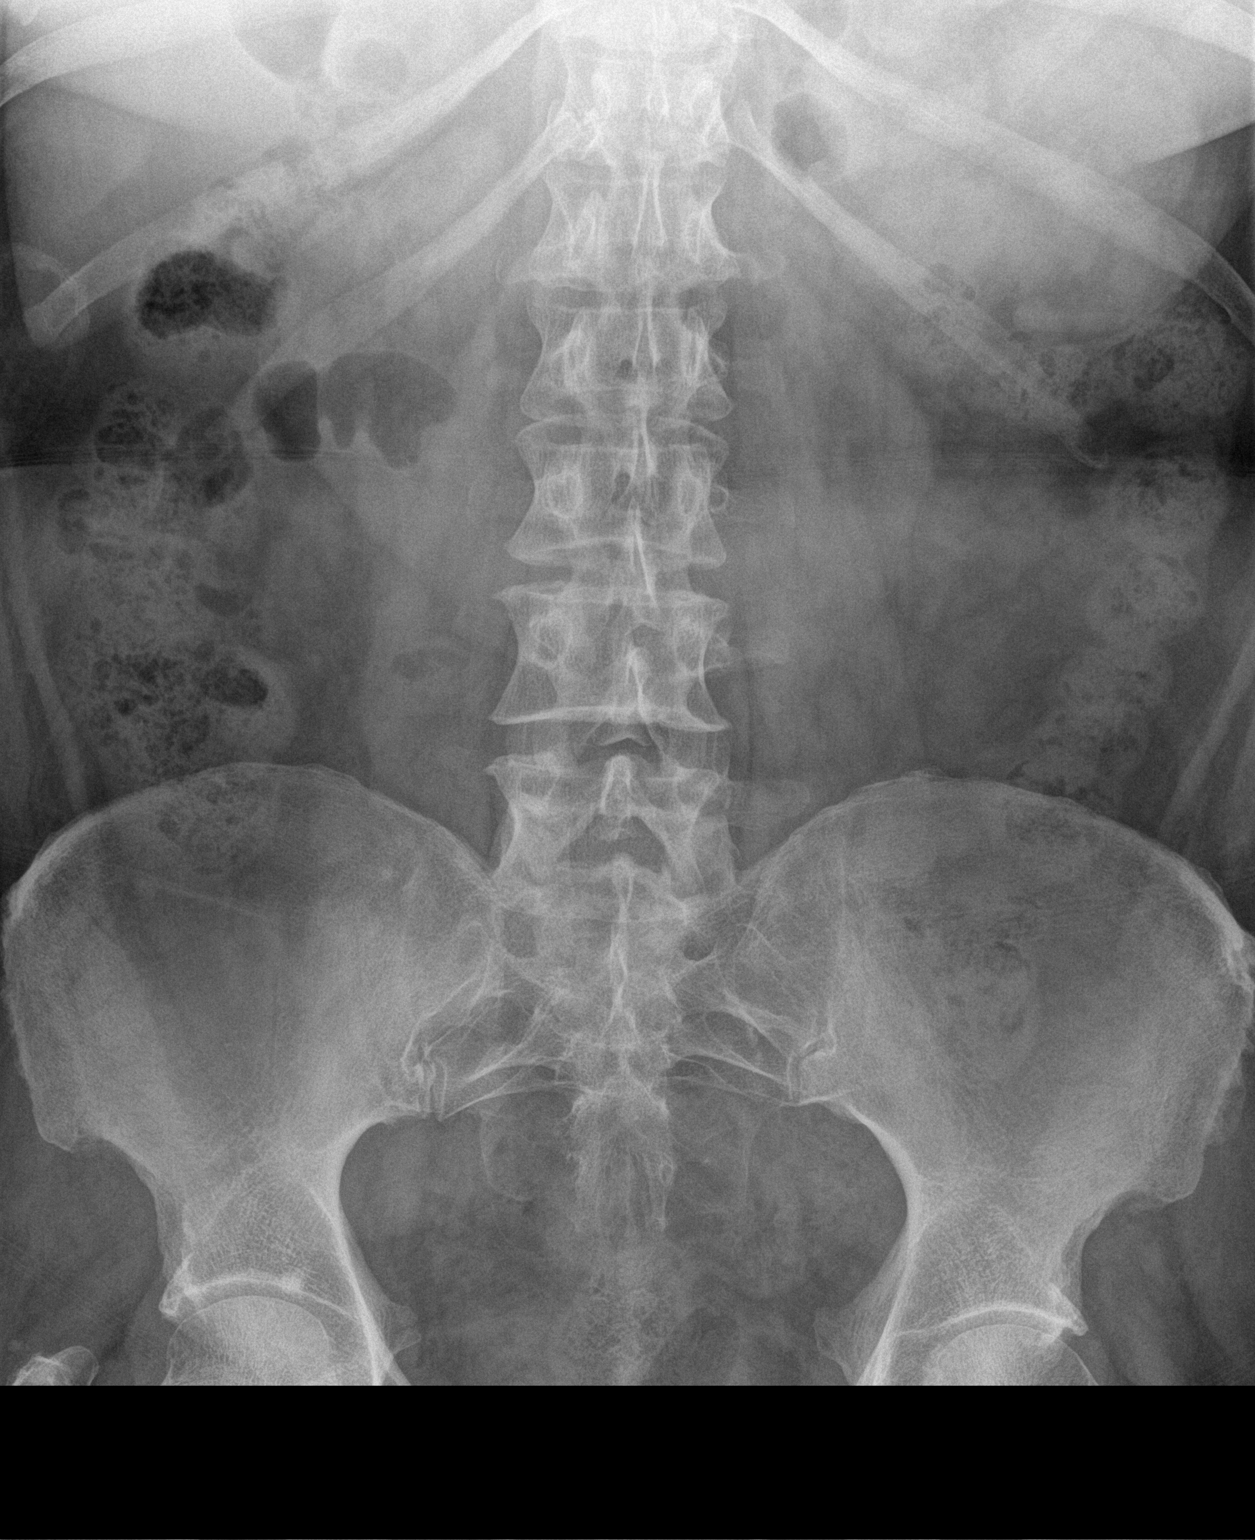

[1 of 1 positions shown; findings below may reference images not displayed]

FINDINGS: Bowel gas pattern is normal. No abnormal calcifications. No acute
bony finding. Air shadow projecting over the lower pelvis could be
in the rectum or vagina.
IMPRESSION: No significant plain radiographic finding.

## 2016-01-09 DIAGNOSIS — L304 Erythema intertrigo: Secondary | ICD-10-CM | POA: Diagnosis not present

## 2016-01-09 DIAGNOSIS — I10 Essential (primary) hypertension: Secondary | ICD-10-CM | POA: Diagnosis not present

## 2016-01-15 DIAGNOSIS — B356 Tinea cruris: Secondary | ICD-10-CM | POA: Diagnosis not present

## 2016-01-15 MED FILL — KETOCONAZOLE 200 MG TABLET: 200 | 10 days supply | Qty: 10 | Fill #0

## 2016-02-24 MED FILL — AMOXICILLIN 500 MG CAPSULE: 500 | 4 days supply | Qty: 16 | Fill #0

## 2016-03-01 MED FILL — PANTOPRAZOLE SOD DR 40 MG T: 40 | 90 days supply | Qty: 90 | Fill #1

## 2016-03-04 MED FILL — HYDROCHLOROTHIAZIDE 12.5 MG: 12.5 | 90 days supply | Qty: 90 | Fill #0

## 2016-03-09 DIAGNOSIS — Z1231 Encounter for screening mammogram for malignant neoplasm of breast: Secondary | ICD-10-CM | POA: Diagnosis not present

## 2016-04-05 DIAGNOSIS — E7800 Pure hypercholesterolemia, unspecified: Secondary | ICD-10-CM | POA: Diagnosis not present

## 2016-04-05 DIAGNOSIS — I10 Essential (primary) hypertension: Secondary | ICD-10-CM | POA: Diagnosis not present

## 2016-04-05 DIAGNOSIS — K219 Gastro-esophageal reflux disease without esophagitis: Secondary | ICD-10-CM | POA: Diagnosis not present

## 2016-04-05 DIAGNOSIS — K589 Irritable bowel syndrome without diarrhea: Secondary | ICD-10-CM | POA: Diagnosis not present

## 2016-04-05 DIAGNOSIS — Z0000 Encounter for general adult medical examination without abnormal findings: Secondary | ICD-10-CM | POA: Diagnosis not present

## 2016-04-05 DIAGNOSIS — J309 Allergic rhinitis, unspecified: Secondary | ICD-10-CM | POA: Diagnosis not present

## 2016-04-05 DIAGNOSIS — D509 Iron deficiency anemia, unspecified: Secondary | ICD-10-CM | POA: Diagnosis not present

## 2016-06-21 MED FILL — HYDROCHLOROTHIAZIDE 12.5 MG: 12.5 | 90 days supply | Qty: 90 | Fill #0

## 2016-09-20 MED FILL — PANTOPRAZOLE SOD DR 40 MG T: 40 | 90 days supply | Qty: 90 | Fill #2

## 2016-09-20 MED FILL — HYDROCHLOROTHIAZIDE 12.5 MG: 12.5 | 90 days supply | Qty: 90 | Fill #1

## 2016-10-05 DIAGNOSIS — I10 Essential (primary) hypertension: Secondary | ICD-10-CM | POA: Diagnosis not present

## 2016-10-12 MED FILL — BENAZEPRIL HCL 10 MG TABLET: 10 | 30 days supply | Qty: 30 | Fill #0

## 2016-12-15 MED FILL — HYDROCHLOROTHIAZIDE 12.5 MG: 12.5 | 90 days supply | Qty: 90 | Fill #0

## 2016-12-15 MED FILL — PANTOPRAZOLE SOD DR 40 MG T: 40 | 90 days supply | Qty: 90 | Fill #0

## 2017-03-02 MED FILL — AMOXICILLIN 500 MG CAPSULE: 500 | 45 days supply | Qty: 16 | Fill #0

## 2017-03-15 DIAGNOSIS — Z1231 Encounter for screening mammogram for malignant neoplasm of breast: Secondary | ICD-10-CM | POA: Diagnosis not present

## 2017-03-28 MED FILL — HYDROCHLOROTHIAZIDE 12.5 MG: 12.5 | 90 days supply | Qty: 90 | Fill #1

## 2017-04-19 DIAGNOSIS — N951 Menopausal and female climacteric states: Secondary | ICD-10-CM | POA: Diagnosis not present

## 2017-04-19 DIAGNOSIS — Z0000 Encounter for general adult medical examination without abnormal findings: Secondary | ICD-10-CM | POA: Diagnosis not present

## 2017-04-19 DIAGNOSIS — Z8349 Family history of other endocrine, nutritional and metabolic diseases: Secondary | ICD-10-CM | POA: Diagnosis not present

## 2017-04-19 DIAGNOSIS — I10 Essential (primary) hypertension: Secondary | ICD-10-CM | POA: Diagnosis not present

## 2017-04-19 DIAGNOSIS — E7800 Pure hypercholesterolemia, unspecified: Secondary | ICD-10-CM | POA: Diagnosis not present

## 2017-04-19 DIAGNOSIS — K219 Gastro-esophageal reflux disease without esophagitis: Secondary | ICD-10-CM | POA: Diagnosis not present

## 2017-04-19 DIAGNOSIS — J309 Allergic rhinitis, unspecified: Secondary | ICD-10-CM | POA: Diagnosis not present

## 2017-04-19 MED FILL — LOSARTAN POTASSIUM 50 MG TA: 50 | 30 days supply | Qty: 30 | Fill #0

## 2017-05-02 MED FILL — ATORVASTATIN 10 MG TABLET: 10 | 90 days supply | Qty: 90 | Fill #0

## 2017-05-18 MED FILL — LOSARTAN POTASSIUM 50 MG TA: 50 | 90 days supply | Qty: 90 | Fill #1

## 2017-06-23 MED FILL — HYDROCHLOROTHIAZIDE 12.5 MG: 12.5 | 90 days supply | Qty: 90 | Fill #0

## 2017-07-06 DIAGNOSIS — E7800 Pure hypercholesterolemia, unspecified: Secondary | ICD-10-CM | POA: Diagnosis not present

## 2017-07-06 DIAGNOSIS — I10 Essential (primary) hypertension: Secondary | ICD-10-CM | POA: Diagnosis not present

## 2017-07-19 MED FILL — LOSARTAN POTASSIUM 100 MG T: 100 | 30 days supply | Qty: 30 | Fill #0

## 2017-08-24 MED FILL — LOSARTAN POTASSIUM 100 MG T: 100 | 30 days supply | Qty: 30 | Fill #1

## 2017-09-20 MED FILL — ATORVASTATIN 10 MG TABLET: 10 | 90 days supply | Qty: 90 | Fill #1

## 2017-09-20 MED FILL — LOSARTAN POTASSIUM 100 MG T: 100 | 90 days supply | Qty: 90 | Fill #2

## 2017-09-20 MED FILL — HYDROCHLOROTHIAZIDE 12.5 MG: 12.5 | 90 days supply | Qty: 90 | Fill #0

## 2017-09-28 DIAGNOSIS — I10 Essential (primary) hypertension: Secondary | ICD-10-CM | POA: Diagnosis not present

## 2017-09-28 DIAGNOSIS — E7800 Pure hypercholesterolemia, unspecified: Secondary | ICD-10-CM | POA: Diagnosis not present

## 2017-10-09 DIAGNOSIS — S82892A Other fracture of left lower leg, initial encounter for closed fracture: Secondary | ICD-10-CM | POA: Diagnosis not present

## 2017-10-20 MED FILL — PANTOPRAZOLE SOD DR 40 MG T: 40 | 90 days supply | Qty: 90 | Fill #1

## 2017-12-27 MED FILL — HYDROCHLOROTHIAZIDE 12.5 MG: 12.5 | 90 days supply | Qty: 90 | Fill #0

## 2017-12-27 MED FILL — LOSARTAN POTASSIUM 100 MG T: 100 | 90 days supply | Qty: 90 | Fill #3

## 2017-12-27 MED FILL — ATORVASTATIN 10 MG TABLET: 10 | 90 days supply | Qty: 90 | Fill #2

## 2018-02-24 DIAGNOSIS — D485 Neoplasm of uncertain behavior of skin: Secondary | ICD-10-CM | POA: Diagnosis not present

## 2018-02-24 DIAGNOSIS — L821 Other seborrheic keratosis: Secondary | ICD-10-CM | POA: Diagnosis not present

## 2018-02-24 DIAGNOSIS — L918 Other hypertrophic disorders of the skin: Secondary | ICD-10-CM | POA: Diagnosis not present

## 2018-02-24 DIAGNOSIS — L84 Corns and callosities: Secondary | ICD-10-CM | POA: Diagnosis not present

## 2018-02-24 DIAGNOSIS — L814 Other melanin hyperpigmentation: Secondary | ICD-10-CM | POA: Diagnosis not present

## 2018-02-24 DIAGNOSIS — L309 Dermatitis, unspecified: Secondary | ICD-10-CM | POA: Diagnosis not present

## 2018-02-24 DIAGNOSIS — D2239 Melanocytic nevi of other parts of face: Secondary | ICD-10-CM | POA: Diagnosis not present

## 2018-02-24 DIAGNOSIS — D225 Melanocytic nevi of trunk: Secondary | ICD-10-CM | POA: Diagnosis not present

## 2018-03-16 DIAGNOSIS — Z1231 Encounter for screening mammogram for malignant neoplasm of breast: Secondary | ICD-10-CM | POA: Diagnosis not present

## 2018-03-31 MED FILL — LOSARTAN POTASSIUM 100 MG T: 100 | 90 days supply | Qty: 90 | Fill #4

## 2018-03-31 MED FILL — HYDROCHLOROTHIAZIDE 12.5 MG: 12.5 | 90 days supply | Qty: 90 | Fill #1

## 2018-03-31 MED FILL — PANTOPRAZOLE SOD DR 40 MG T: 40 | 90 days supply | Qty: 90 | Fill #0

## 2018-03-31 MED FILL — ATORVASTATIN 10 MG TABLET: 10 | 90 days supply | Qty: 90 | Fill #3

## 2018-05-09 ENCOUNTER — Other Ambulatory Visit (HOSPITAL_COMMUNITY)
Admission: RE | Admit: 2018-05-09 | Discharge: 2018-05-09 | Disposition: A | Discharge status: Home / Self Care | Payer: 59 | Source: Ambulatory Visit | Attending: Family Medicine | Admitting: Family Medicine

## 2018-05-09 ENCOUNTER — Other Ambulatory Visit: Payer: Self-pay | Admitting: Family Medicine

## 2018-05-09 DIAGNOSIS — N951 Menopausal and female climacteric states: Secondary | ICD-10-CM | POA: Diagnosis not present

## 2018-05-09 DIAGNOSIS — Z8349 Family history of other endocrine, nutritional and metabolic diseases: Secondary | ICD-10-CM | POA: Diagnosis not present

## 2018-05-09 DIAGNOSIS — K219 Gastro-esophageal reflux disease without esophagitis: Secondary | ICD-10-CM | POA: Diagnosis not present

## 2018-05-09 DIAGNOSIS — Z01419 Encounter for gynecological examination (general) (routine) without abnormal findings: Secondary | ICD-10-CM | POA: Diagnosis not present

## 2018-05-09 DIAGNOSIS — E7800 Pure hypercholesterolemia, unspecified: Secondary | ICD-10-CM | POA: Diagnosis not present

## 2018-05-09 DIAGNOSIS — Z0000 Encounter for general adult medical examination without abnormal findings: Secondary | ICD-10-CM | POA: Diagnosis not present

## 2018-05-09 DIAGNOSIS — J309 Allergic rhinitis, unspecified: Secondary | ICD-10-CM | POA: Diagnosis not present

## 2018-05-09 DIAGNOSIS — I10 Essential (primary) hypertension: Secondary | ICD-10-CM | POA: Diagnosis not present

## 2018-05-09 DIAGNOSIS — Z124 Encounter for screening for malignant neoplasm of cervix: Secondary | ICD-10-CM | POA: Diagnosis not present

## 2018-05-09 DIAGNOSIS — Z1159 Encounter for screening for other viral diseases: Secondary | ICD-10-CM | POA: Diagnosis not present

## 2018-05-09 MED FILL — AMLODIPINE BESYLATE 5 MG TA: 5 | 90 days supply | Qty: 90 | Fill #0

## 2018-05-11 LAB — CYTOLOGY - PAP: Diagnosis: NEGATIVE

## 2018-06-19 MED FILL — LOSARTAN POTASSIUM 100 MG T: 100 | 30 days supply | Qty: 30 | Fill #5

## 2018-06-19 MED FILL — HYDROCHLOROTHIAZIDE 12.5 MG: 12.5 | 90 days supply | Qty: 90 | Fill #0

## 2018-06-30 DIAGNOSIS — S8012XA Contusion of left lower leg, initial encounter: Secondary | ICD-10-CM | POA: Diagnosis not present

## 2018-06-30 DIAGNOSIS — L03116 Cellulitis of left lower limb: Secondary | ICD-10-CM | POA: Diagnosis not present

## 2018-07-04 DIAGNOSIS — Z780 Asymptomatic menopausal state: Secondary | ICD-10-CM | POA: Diagnosis not present

## 2018-07-04 DIAGNOSIS — Z13820 Encounter for screening for osteoporosis: Secondary | ICD-10-CM | POA: Diagnosis not present

## 2018-07-25 MED FILL — PANTOPRAZOLE SOD DR 40 MG T: 40 | 90 days supply | Qty: 90 | Fill #1

## 2018-07-25 MED FILL — ATORVASTATIN 10 MG TABLET: 10 | 90 days supply | Qty: 90 | Fill #0

## 2018-07-25 MED FILL — AMLODIPINE BESYLATE 5 MG TA: 5 | 90 days supply | Qty: 90 | Fill #1

## 2018-08-01 DIAGNOSIS — S82892D Other fracture of left lower leg, subsequent encounter for closed fracture with routine healing: Secondary | ICD-10-CM | POA: Diagnosis not present

## 2018-08-10 DIAGNOSIS — I10 Essential (primary) hypertension: Secondary | ICD-10-CM | POA: Diagnosis not present

## 2018-08-15 MED FILL — AMOXICILLIN 500 MG CAPSULE: 500 | 4 days supply | Qty: 16 | Fill #0

## 2018-09-18 MED FILL — LOSARTAN POTASSIUM 100 MG T: 100 | 90 days supply | Qty: 90 | Fill #0

## 2018-09-18 MED FILL — HYDROCHLOROTHIAZIDE 12.5 MG: 12.5 | 90 days supply | Qty: 90 | Fill #1

## 2018-10-30 MED FILL — AMLODIPINE BESYLATE 5 MG TA: 5 | 90 days supply | Qty: 90 | Fill #2

## 2018-12-04 MED FILL — ATORVASTATIN 10 MG TABLET: 10 | 90 days supply | Qty: 90 | Fill #1

## 2018-12-04 MED FILL — PANTOPRAZOLE SOD DR 40 MG T: 40 | 90 days supply | Qty: 90 | Fill #0

## 2018-12-04 MED FILL — LOSARTAN POTASSIUM 100 MG T: 100 | 90 days supply | Qty: 90 | Fill #1

## 2018-12-27 MED FILL — HYDROCHLOROTHIAZIDE 12.5 MG: 12.5 | 90 days supply | Qty: 90 | Fill #2

## 2019-01-04 DIAGNOSIS — N950 Postmenopausal bleeding: Secondary | ICD-10-CM | POA: Diagnosis not present

## 2019-01-12 ENCOUNTER — Other Ambulatory Visit: Payer: Self-pay | Admitting: Obstetrics & Gynecology

## 2019-01-12 DIAGNOSIS — N858 Other specified noninflammatory disorders of uterus: Secondary | ICD-10-CM | POA: Diagnosis not present

## 2019-01-12 DIAGNOSIS — Z9889 Other specified postprocedural states: Secondary | ICD-10-CM | POA: Diagnosis not present

## 2019-01-12 DIAGNOSIS — N950 Postmenopausal bleeding: Secondary | ICD-10-CM | POA: Diagnosis not present

## 2019-01-12 DIAGNOSIS — Z6841 Body Mass Index (BMI) 40.0 and over, adult: Secondary | ICD-10-CM | POA: Diagnosis not present

## 2019-01-24 DIAGNOSIS — N950 Postmenopausal bleeding: Secondary | ICD-10-CM | POA: Diagnosis not present

## 2019-01-30 MED FILL — AMLODIPINE BESYLATE 5 MG TA: 5 | 90 days supply | Qty: 90 | Fill #3

## 2019-02-26 MED FILL — LOSARTAN POTASSIUM 100 MG T: 100 | 90 days supply | Qty: 90 | Fill #2

## 2019-02-27 MED FILL — AMOXICILLIN 500 MG CAPSULE: 500 | 4 days supply | Qty: 16 | Fill #0

## 2019-03-12 MED FILL — ATORVASTATIN 10 MG TABLET: 10 | 90 days supply | Qty: 90 | Fill #0

## 2019-03-12 MED FILL — PANTOPRAZOLE SOD DR 40 MG T: 40 | 90 days supply | Qty: 90 | Fill #0

## 2019-03-12 MED FILL — HYDROCHLOROTHIAZIDE 12.5 MG: 12.5 | 90 days supply | Qty: 90 | Fill #0

## 2019-04-26 MED FILL — AMLODIPINE BESYLATE 5 MG TA: 5 | 90 days supply | Qty: 90 | Fill #0

## 2019-05-24 MED FILL — LOSARTAN POTASSIUM 100 MG T: 100 | 60 days supply | Qty: 60 | Fill #3

## 2019-05-30 DIAGNOSIS — I10 Essential (primary) hypertension: Secondary | ICD-10-CM | POA: Diagnosis not present

## 2019-05-30 DIAGNOSIS — K219 Gastro-esophageal reflux disease without esophagitis: Secondary | ICD-10-CM | POA: Diagnosis not present

## 2019-05-30 DIAGNOSIS — D509 Iron deficiency anemia, unspecified: Secondary | ICD-10-CM | POA: Diagnosis not present

## 2019-05-30 DIAGNOSIS — Z0000 Encounter for general adult medical examination without abnormal findings: Secondary | ICD-10-CM | POA: Diagnosis not present

## 2019-05-30 DIAGNOSIS — N951 Menopausal and female climacteric states: Secondary | ICD-10-CM | POA: Diagnosis not present

## 2019-05-30 DIAGNOSIS — E7800 Pure hypercholesterolemia, unspecified: Secondary | ICD-10-CM | POA: Diagnosis not present

## 2019-05-30 DIAGNOSIS — N950 Postmenopausal bleeding: Secondary | ICD-10-CM | POA: Diagnosis not present

## 2019-05-30 DIAGNOSIS — K589 Irritable bowel syndrome without diarrhea: Secondary | ICD-10-CM | POA: Diagnosis not present

## 2019-05-30 DIAGNOSIS — J309 Allergic rhinitis, unspecified: Secondary | ICD-10-CM | POA: Diagnosis not present

## 2019-05-30 MED FILL — ATORVASTATIN 10 MG TABLET: 10 | 90 days supply | Qty: 90 | Fill #0

## 2019-05-30 MED FILL — HYDROCHLOROTHIAZIDE 12.5 MG: 12.5 | 90 days supply | Qty: 90 | Fill #0

## 2019-05-30 MED FILL — CEPHALEXIN 250 MG CAPSULE: 250 | 5 days supply | Qty: 10 | Fill #0

## 2019-07-04 DIAGNOSIS — Z1231 Encounter for screening mammogram for malignant neoplasm of breast: Secondary | ICD-10-CM | POA: Diagnosis not present

## 2019-07-20 MED FILL — PANTOPRAZOLE SOD DR 40 MG T: 40 | 90 days supply | Qty: 90 | Fill #0

## 2019-08-02 MED FILL — AMLODIPINE BESYLATE 5 MG TA: 5 | 90 days supply | Qty: 90 | Fill #0

## 2019-09-04 MED FILL — ATORVASTATIN 10 MG TABLET: 10 | 90 days supply | Qty: 90 | Fill #1

## 2019-09-04 MED FILL — HYDROCHLOROTHIAZIDE 12.5 MG: 12.5 | 90 days supply | Qty: 90 | Fill #1

## 2019-09-04 MED FILL — LOSARTAN POTASSIUM 100 MG T: 100 | 90 days supply | Qty: 90 | Fill #0

## 2019-10-29 MED FILL — AMLODIPINE BESYLATE 5 MG TA: 5 | 90 days supply | Qty: 90 | Fill #1

## 2019-12-17 MED FILL — HYDROCHLOROTHIAZIDE 12.5 MG: 12.5 | 90 days supply | Qty: 90 | Fill #2

## 2019-12-17 MED FILL — PANTOPRAZOLE SOD DR 40 MG T: 40 | 90 days supply | Qty: 90 | Fill #0

## 2019-12-17 MED FILL — ATORVASTATIN 10 MG TABLET: 10 | 90 days supply | Qty: 90 | Fill #2

## 2019-12-17 MED FILL — LOSARTAN POTASSIUM 100 MG T: 100 | 90 days supply | Qty: 90 | Fill #1

## 2020-01-29 MED FILL — AMLODIPINE BESYLATE 5 MG TA: 5 | 90 days supply | Qty: 90 | Fill #2

## 2020-02-11 DIAGNOSIS — H524 Presbyopia: Secondary | ICD-10-CM | POA: Diagnosis not present

## 2020-02-11 DIAGNOSIS — H2513 Age-related nuclear cataract, bilateral: Secondary | ICD-10-CM | POA: Diagnosis not present

## 2020-02-11 DIAGNOSIS — H5203 Hypermetropia, bilateral: Secondary | ICD-10-CM | POA: Diagnosis not present

## 2020-02-11 DIAGNOSIS — H52223 Regular astigmatism, bilateral: Secondary | ICD-10-CM | POA: Diagnosis not present

## 2020-03-19 MED FILL — ATORVASTATIN 10 MG TABLET: 10 | 90 days supply | Qty: 90 | Fill #3

## 2020-03-19 MED FILL — HYDROCHLOROTHIAZIDE 12.5 MG: 12.5 | 90 days supply | Qty: 90 | Fill #3

## 2020-03-19 MED FILL — LOSARTAN POTASSIUM 100 MG T: 100 | 90 days supply | Qty: 90 | Fill #2

## 2020-03-19 MED FILL — PANTOPRAZOLE SOD DR 40 MG T: 40 | 90 days supply | Qty: 90 | Fill #1

## 2020-04-21 MED FILL — AMLODIPINE BESYLATE 5 MG TA: 5 | 90 days supply | Qty: 90 | Fill #3

## 2020-04-22 MED FILL — AMOXICILLIN 500 MG CAPSULE: 500 | 4 days supply | Qty: 16 | Fill #0

## 2020-06-03 DIAGNOSIS — K589 Irritable bowel syndrome without diarrhea: Secondary | ICD-10-CM | POA: Diagnosis not present

## 2020-06-03 DIAGNOSIS — E7800 Pure hypercholesterolemia, unspecified: Secondary | ICD-10-CM | POA: Diagnosis not present

## 2020-06-03 DIAGNOSIS — Z0000 Encounter for general adult medical examination without abnormal findings: Secondary | ICD-10-CM | POA: Diagnosis not present

## 2020-06-03 DIAGNOSIS — K219 Gastro-esophageal reflux disease without esophagitis: Secondary | ICD-10-CM | POA: Diagnosis not present

## 2020-06-03 DIAGNOSIS — J309 Allergic rhinitis, unspecified: Secondary | ICD-10-CM | POA: Diagnosis not present

## 2020-06-03 DIAGNOSIS — I10 Essential (primary) hypertension: Secondary | ICD-10-CM | POA: Diagnosis not present

## 2020-06-03 DIAGNOSIS — N951 Menopausal and female climacteric states: Secondary | ICD-10-CM | POA: Diagnosis not present

## 2020-06-11 ENCOUNTER — Other Ambulatory Visit (HOSPITAL_COMMUNITY): Payer: Self-pay | Admitting: Family Medicine

## 2020-06-11 MED FILL — HYDROCHLOROTHIAZIDE 12.5 MG: 12.5 | 90 days supply | Qty: 90 | Fill #0

## 2020-06-11 MED FILL — ATORVASTATIN CALCIUM 10 MG: 10 | 90 days supply | Qty: 90 | Fill #0

## 2020-06-11 MED FILL — LOSARTAN POTASSIUM 100 MG T: 100 | 90 days supply | Qty: 90 | Fill #0

## 2020-06-11 MED FILL — PANTOPRAZOLE SOD DR 40 MG T: 40 | 90 days supply | Qty: 90 | Fill #0

## 2020-07-09 DIAGNOSIS — Z1231 Encounter for screening mammogram for malignant neoplasm of breast: Secondary | ICD-10-CM | POA: Diagnosis not present

## 2020-07-21 ENCOUNTER — Other Ambulatory Visit (HOSPITAL_COMMUNITY): Payer: Self-pay | Admitting: Family Medicine

## 2020-07-21 MED FILL — AMLODIPINE BESYLATE 5 MG TA: 5 | 90 days supply | Qty: 90 | Fill #0

## 2020-09-09 MED FILL — ATORVASTATIN CALCIUM 10 MG: 10 | 90 days supply | Qty: 90 | Fill #1

## 2020-09-09 MED FILL — LOSARTAN POTASSIUM 100 MG T: 100 | 90 days supply | Qty: 90 | Fill #1

## 2020-09-09 MED FILL — HYDROCHLOROTHIAZIDE 12.5 MG: 12.5 | 90 days supply | Qty: 90 | Fill #1

## 2020-09-09 MED FILL — PANTOPRAZOLE SOD DR 40 MG T: 40 | 90 days supply | Qty: 90 | Fill #1

## 2020-09-24 DIAGNOSIS — L814 Other melanin hyperpigmentation: Secondary | ICD-10-CM | POA: Diagnosis not present

## 2020-09-24 DIAGNOSIS — D485 Neoplasm of uncertain behavior of skin: Secondary | ICD-10-CM | POA: Diagnosis not present

## 2020-09-24 DIAGNOSIS — D225 Melanocytic nevi of trunk: Secondary | ICD-10-CM | POA: Diagnosis not present

## 2020-09-24 DIAGNOSIS — L821 Other seborrheic keratosis: Secondary | ICD-10-CM | POA: Diagnosis not present

## 2020-09-24 DIAGNOSIS — R238 Other skin changes: Secondary | ICD-10-CM | POA: Diagnosis not present

## 2020-10-22 MED FILL — AMLODIPINE BESYLATE 5 MG TA: 5 | 90 days supply | Qty: 90 | Fill #1

## 2020-12-02 MED FILL — LOSARTAN POTASSIUM 100 MG T: 100 | 90 days supply | Qty: 90 | Fill #2

## 2020-12-02 MED FILL — HYDROCHLOROTHIAZIDE 12.5 MG: 12.5 | 90 days supply | Qty: 90 | Fill #2

## 2020-12-02 MED FILL — PANTOPRAZOLE SOD DR 40 MG T: 40 | 90 days supply | Qty: 90 | Fill #2

## 2020-12-02 MED FILL — ATORVASTATIN CALCIUM 10 MG: 10 | 90 days supply | Qty: 90 | Fill #2

## 2021-01-20 MED FILL — AMLODIPINE BESYLATE 5 MG TA: 5 | 90 days supply | Qty: 90 | Fill #2

## 2021-02-23 MED FILL — LOSARTAN POTASSIUM 100 MG T: 100 | 30 days supply | Qty: 30 | Fill #3

## 2021-02-23 MED FILL — ATORVASTATIN CALCIUM 10 MG: 10 | 90 days supply | Qty: 90 | Fill #3

## 2021-02-23 MED FILL — HYDROCHLOROTHIAZIDE 12.5 MG: 12.5 | 90 days supply | Qty: 90 | Fill #3

## 2021-02-23 MED FILL — PANTOPRAZOLE SOD DR 40 MG T: 40 | 90 days supply | Qty: 90 | Fill #3

## 2021-03-23 ENCOUNTER — Other Ambulatory Visit (HOSPITAL_COMMUNITY): Payer: Self-pay

## 2021-03-23 MED FILL — Losartan Potassium Tab 100 MG: ORAL | 30 days supply | Qty: 30 | Fill #0 | Status: AC

## 2021-03-24 ENCOUNTER — Other Ambulatory Visit (HOSPITAL_COMMUNITY): Payer: Self-pay

## 2021-04-07 ENCOUNTER — Other Ambulatory Visit (HOSPITAL_COMMUNITY): Payer: Self-pay

## 2021-04-07 MED ORDER — AMOXICILLIN 500 MG PO CAPS
2000.0000 mg | ORAL_CAPSULE | ORAL | 99 refills | Status: DC
Start: 2020-04-20 — End: 2023-11-08
  Filled 2021-04-07: qty 16, 4d supply, fill #0

## 2021-04-08 ENCOUNTER — Other Ambulatory Visit (HOSPITAL_COMMUNITY): Payer: Self-pay

## 2021-04-08 DIAGNOSIS — R195 Other fecal abnormalities: Secondary | ICD-10-CM | POA: Diagnosis not present

## 2021-04-08 MED ORDER — HYOSCYAMINE SULFATE SL 0.125 MG SL SUBL
1.0000 | SUBLINGUAL_TABLET | Freq: Four times a day (QID) | SUBLINGUAL | 1 refills | Status: DC | PRN
  Filled 2021-04-08: qty 40, 10d supply, fill #0

## 2021-04-15 ENCOUNTER — Other Ambulatory Visit (HOSPITAL_COMMUNITY): Payer: Self-pay

## 2021-04-15 MED FILL — Losartan Potassium Tab 100 MG: ORAL | 30 days supply | Qty: 30 | Fill #1 | Status: AC

## 2021-04-15 MED FILL — Amlodipine Besylate Tab 5 MG (Base Equivalent): ORAL | 90 days supply | Qty: 90 | Fill #0 | Status: AC

## 2021-04-16 ENCOUNTER — Other Ambulatory Visit (HOSPITAL_COMMUNITY): Payer: Self-pay

## 2021-04-17 ENCOUNTER — Other Ambulatory Visit (HOSPITAL_COMMUNITY): Payer: Self-pay

## 2021-04-20 ENCOUNTER — Other Ambulatory Visit (HOSPITAL_COMMUNITY): Payer: Self-pay

## 2021-06-01 ENCOUNTER — Other Ambulatory Visit (HOSPITAL_COMMUNITY): Payer: Self-pay

## 2021-06-02 ENCOUNTER — Other Ambulatory Visit (HOSPITAL_COMMUNITY): Payer: Self-pay

## 2021-06-02 MED ORDER — LOSARTAN POTASSIUM 100 MG PO TABS
100.0000 mg | ORAL_TABLET | Freq: Every day | ORAL | 1 refills | Status: DC
  Filled 2021-06-02: qty 90, 90d supply, fill #0
  Filled 2021-08-31 – 2021-09-01 (×2): qty 90, 90d supply, fill #1

## 2021-06-02 MED ORDER — HYDROCHLOROTHIAZIDE 12.5 MG PO TABS
12.5000 mg | ORAL_TABLET | Freq: Every morning | ORAL | 1 refills | Status: DC
  Filled 2021-06-02: qty 90, 90d supply, fill #0
  Filled 2021-08-31 – 2021-09-01 (×2): qty 90, 90d supply, fill #1

## 2021-06-04 DIAGNOSIS — Z0000 Encounter for general adult medical examination without abnormal findings: Secondary | ICD-10-CM | POA: Diagnosis not present

## 2021-06-04 DIAGNOSIS — E7800 Pure hypercholesterolemia, unspecified: Secondary | ICD-10-CM | POA: Diagnosis not present

## 2021-06-04 DIAGNOSIS — I10 Essential (primary) hypertension: Secondary | ICD-10-CM | POA: Diagnosis not present

## 2021-06-04 DIAGNOSIS — K219 Gastro-esophageal reflux disease without esophagitis: Secondary | ICD-10-CM | POA: Diagnosis not present

## 2021-06-04 DIAGNOSIS — K589 Irritable bowel syndrome without diarrhea: Secondary | ICD-10-CM | POA: Diagnosis not present

## 2021-06-04 DIAGNOSIS — D509 Iron deficiency anemia, unspecified: Secondary | ICD-10-CM | POA: Diagnosis not present

## 2021-06-04 DIAGNOSIS — Z124 Encounter for screening for malignant neoplasm of cervix: Secondary | ICD-10-CM | POA: Diagnosis not present

## 2021-07-15 ENCOUNTER — Other Ambulatory Visit (HOSPITAL_COMMUNITY): Payer: Self-pay

## 2021-07-15 DIAGNOSIS — Z1231 Encounter for screening mammogram for malignant neoplasm of breast: Secondary | ICD-10-CM | POA: Diagnosis not present

## 2021-07-15 MED ORDER — ATORVASTATIN CALCIUM 10 MG PO TABS
10.0000 mg | ORAL_TABLET | Freq: Every day | ORAL | 3 refills | Status: DC
  Filled 2021-07-15: qty 51, 51d supply, fill #0
  Filled 2021-07-15: qty 39, 39d supply, fill #0
  Filled 2021-10-13: qty 90, 90d supply, fill #1
  Filled 2022-01-13: qty 90, 90d supply, fill #2
  Filled 2022-04-13: qty 90, 90d supply, fill #3

## 2021-07-15 MED ORDER — PANTOPRAZOLE SODIUM 40 MG PO TBEC
40.0000 mg | DELAYED_RELEASE_TABLET | Freq: Every day | ORAL | 3 refills | Status: DC
  Filled 2021-07-15: qty 52, 52d supply, fill #0
  Filled 2021-07-15: qty 38, 38d supply, fill #0
  Filled 2021-10-13: qty 90, 90d supply, fill #1
  Filled 2022-01-13: qty 90, 90d supply, fill #2
  Filled 2022-04-13: qty 90, 90d supply, fill #3

## 2021-07-21 ENCOUNTER — Other Ambulatory Visit (HOSPITAL_COMMUNITY): Payer: Self-pay

## 2021-07-21 MED ORDER — ZOSTER VAC RECOMB ADJUVANTED 50 MCG/0.5ML IM SUSR
INTRAMUSCULAR | 1 refills | Status: AC
  Filled 2021-07-21: qty 0.5, 1d supply, fill #0
  Filled 2021-09-28: qty 0.5, 1d supply, fill #1

## 2021-07-22 ENCOUNTER — Other Ambulatory Visit (HOSPITAL_COMMUNITY): Payer: Self-pay

## 2021-07-28 ENCOUNTER — Other Ambulatory Visit (HOSPITAL_COMMUNITY): Payer: Self-pay

## 2021-07-28 MED ORDER — AMLODIPINE BESYLATE 5 MG PO TABS
5.0000 mg | ORAL_TABLET | Freq: Every day | ORAL | 4 refills | Status: DC
  Filled 2021-07-28: qty 90, 90d supply, fill #0
  Filled 2021-10-28: qty 90, 90d supply, fill #1
  Filled 2022-02-01: qty 90, 90d supply, fill #2
  Filled 2022-04-27: qty 90, 90d supply, fill #3
  Filled 2022-07-26: qty 90, 90d supply, fill #4

## 2021-09-01 ENCOUNTER — Other Ambulatory Visit (HOSPITAL_COMMUNITY): Payer: Self-pay

## 2021-09-23 ENCOUNTER — Other Ambulatory Visit (HOSPITAL_COMMUNITY): Payer: Self-pay

## 2021-09-28 ENCOUNTER — Other Ambulatory Visit (HOSPITAL_COMMUNITY): Payer: Self-pay

## 2021-09-30 ENCOUNTER — Other Ambulatory Visit (HOSPITAL_COMMUNITY): Payer: Self-pay

## 2021-10-13 ENCOUNTER — Other Ambulatory Visit (HOSPITAL_COMMUNITY): Payer: Self-pay

## 2021-10-28 ENCOUNTER — Other Ambulatory Visit (HOSPITAL_COMMUNITY): Payer: Self-pay

## 2021-11-25 DIAGNOSIS — D485 Neoplasm of uncertain behavior of skin: Secondary | ICD-10-CM | POA: Diagnosis not present

## 2021-11-25 DIAGNOSIS — Z872 Personal history of diseases of the skin and subcutaneous tissue: Secondary | ICD-10-CM | POA: Diagnosis not present

## 2021-11-25 DIAGNOSIS — L821 Other seborrheic keratosis: Secondary | ICD-10-CM | POA: Diagnosis not present

## 2021-11-25 DIAGNOSIS — D225 Melanocytic nevi of trunk: Secondary | ICD-10-CM | POA: Diagnosis not present

## 2021-11-25 DIAGNOSIS — D2262 Melanocytic nevi of left upper limb, including shoulder: Secondary | ICD-10-CM | POA: Diagnosis not present

## 2021-11-25 DIAGNOSIS — D2239 Melanocytic nevi of other parts of face: Secondary | ICD-10-CM | POA: Diagnosis not present

## 2021-11-25 DIAGNOSIS — L814 Other melanin hyperpigmentation: Secondary | ICD-10-CM | POA: Diagnosis not present

## 2021-12-02 ENCOUNTER — Other Ambulatory Visit (HOSPITAL_COMMUNITY): Payer: Self-pay

## 2021-12-03 ENCOUNTER — Other Ambulatory Visit (HOSPITAL_COMMUNITY): Payer: Self-pay

## 2021-12-03 MED ORDER — LOSARTAN POTASSIUM 100 MG PO TABS
100.0000 mg | ORAL_TABLET | Freq: Every day | ORAL | 1 refills | Status: DC
  Filled 2021-12-03: qty 90, 90d supply, fill #0
  Filled 2022-03-02: qty 90, 90d supply, fill #1

## 2021-12-03 MED ORDER — HYDROCHLOROTHIAZIDE 12.5 MG PO TABS
12.5000 mg | ORAL_TABLET | Freq: Every morning | ORAL | 1 refills | Status: DC
  Filled 2021-12-03: qty 90, 90d supply, fill #0
  Filled 2022-03-02: qty 90, 90d supply, fill #1

## 2021-12-09 DIAGNOSIS — I10 Essential (primary) hypertension: Secondary | ICD-10-CM | POA: Diagnosis not present

## 2021-12-09 DIAGNOSIS — E7800 Pure hypercholesterolemia, unspecified: Secondary | ICD-10-CM | POA: Diagnosis not present

## 2022-01-13 ENCOUNTER — Other Ambulatory Visit (HOSPITAL_COMMUNITY): Payer: Self-pay

## 2022-02-01 ENCOUNTER — Other Ambulatory Visit (HOSPITAL_COMMUNITY): Payer: Self-pay

## 2022-03-02 ENCOUNTER — Other Ambulatory Visit (HOSPITAL_COMMUNITY): Payer: Self-pay

## 2022-03-10 ENCOUNTER — Other Ambulatory Visit (HOSPITAL_COMMUNITY): Payer: Self-pay

## 2022-03-10 DIAGNOSIS — R21 Rash and other nonspecific skin eruption: Secondary | ICD-10-CM | POA: Diagnosis not present

## 2022-03-10 MED ORDER — PREDNISONE 20 MG PO TABS
ORAL_TABLET | ORAL | 0 refills | Status: AC
  Filled 2022-03-10: qty 18, 9d supply, fill #0

## 2022-04-14 ENCOUNTER — Other Ambulatory Visit (HOSPITAL_COMMUNITY): Payer: Self-pay

## 2022-04-28 ENCOUNTER — Other Ambulatory Visit (HOSPITAL_COMMUNITY): Payer: Self-pay

## 2022-06-01 ENCOUNTER — Other Ambulatory Visit (HOSPITAL_COMMUNITY): Payer: Self-pay

## 2022-06-02 ENCOUNTER — Other Ambulatory Visit (HOSPITAL_COMMUNITY): Payer: Self-pay

## 2022-06-02 MED ORDER — HYDROCHLOROTHIAZIDE 12.5 MG PO TABS
12.5000 mg | ORAL_TABLET | Freq: Every morning | ORAL | 0 refills | Status: DC
  Filled 2022-06-02: qty 90, 90d supply, fill #0

## 2022-06-02 MED ORDER — LOSARTAN POTASSIUM 100 MG PO TABS
100.0000 mg | ORAL_TABLET | Freq: Every day | ORAL | 0 refills | Status: DC
  Filled 2022-06-02: qty 90, 90d supply, fill #0

## 2022-06-09 DIAGNOSIS — K589 Irritable bowel syndrome without diarrhea: Secondary | ICD-10-CM | POA: Diagnosis not present

## 2022-06-09 DIAGNOSIS — N951 Menopausal and female climacteric states: Secondary | ICD-10-CM | POA: Diagnosis not present

## 2022-06-09 DIAGNOSIS — I10 Essential (primary) hypertension: Secondary | ICD-10-CM | POA: Diagnosis not present

## 2022-06-09 DIAGNOSIS — D509 Iron deficiency anemia, unspecified: Secondary | ICD-10-CM | POA: Diagnosis not present

## 2022-06-09 DIAGNOSIS — E7800 Pure hypercholesterolemia, unspecified: Secondary | ICD-10-CM | POA: Diagnosis not present

## 2022-06-09 DIAGNOSIS — Z0000 Encounter for general adult medical examination without abnormal findings: Secondary | ICD-10-CM | POA: Diagnosis not present

## 2022-06-09 DIAGNOSIS — N920 Excessive and frequent menstruation with regular cycle: Secondary | ICD-10-CM | POA: Diagnosis not present

## 2022-06-09 DIAGNOSIS — K219 Gastro-esophageal reflux disease without esophagitis: Secondary | ICD-10-CM | POA: Diagnosis not present

## 2022-06-09 DIAGNOSIS — J309 Allergic rhinitis, unspecified: Secondary | ICD-10-CM | POA: Diagnosis not present

## 2022-07-06 ENCOUNTER — Other Ambulatory Visit (HOSPITAL_COMMUNITY): Payer: Self-pay

## 2022-07-07 ENCOUNTER — Other Ambulatory Visit (HOSPITAL_COMMUNITY): Payer: Self-pay

## 2022-07-07 MED ORDER — ATORVASTATIN CALCIUM 10 MG PO TABS
10.0000 mg | ORAL_TABLET | Freq: Every day | ORAL | 1 refills | Status: DC
  Filled 2022-07-07: qty 90, 90d supply, fill #0
  Filled 2022-10-12: qty 90, 90d supply, fill #1

## 2022-07-07 MED ORDER — PANTOPRAZOLE SODIUM 40 MG PO TBEC
40.0000 mg | DELAYED_RELEASE_TABLET | Freq: Every day | ORAL | 1 refills | Status: DC
  Filled 2022-07-07: qty 60, 60d supply, fill #0
  Filled 2022-07-07: qty 30, 30d supply, fill #0
  Filled 2022-10-12: qty 90, 90d supply, fill #1

## 2022-07-09 ENCOUNTER — Other Ambulatory Visit (HOSPITAL_COMMUNITY): Payer: Self-pay

## 2022-07-20 ENCOUNTER — Other Ambulatory Visit (HOSPITAL_COMMUNITY): Payer: Self-pay

## 2022-07-21 DIAGNOSIS — Z1231 Encounter for screening mammogram for malignant neoplasm of breast: Secondary | ICD-10-CM | POA: Diagnosis not present

## 2022-07-23 ENCOUNTER — Other Ambulatory Visit (HOSPITAL_COMMUNITY): Payer: Self-pay

## 2022-07-26 ENCOUNTER — Other Ambulatory Visit (HOSPITAL_COMMUNITY): Payer: Self-pay

## 2022-07-27 ENCOUNTER — Other Ambulatory Visit (HOSPITAL_COMMUNITY): Payer: Self-pay

## 2022-08-03 DIAGNOSIS — L814 Other melanin hyperpigmentation: Secondary | ICD-10-CM | POA: Diagnosis not present

## 2022-08-03 DIAGNOSIS — Z872 Personal history of diseases of the skin and subcutaneous tissue: Secondary | ICD-10-CM | POA: Diagnosis not present

## 2022-08-03 DIAGNOSIS — D2262 Melanocytic nevi of left upper limb, including shoulder: Secondary | ICD-10-CM | POA: Diagnosis not present

## 2022-08-03 DIAGNOSIS — D225 Melanocytic nevi of trunk: Secondary | ICD-10-CM | POA: Diagnosis not present

## 2022-08-03 DIAGNOSIS — L821 Other seborrheic keratosis: Secondary | ICD-10-CM | POA: Diagnosis not present

## 2022-09-05 ENCOUNTER — Other Ambulatory Visit (HOSPITAL_COMMUNITY): Payer: Self-pay

## 2022-09-06 ENCOUNTER — Other Ambulatory Visit (HOSPITAL_COMMUNITY): Payer: Self-pay

## 2022-09-06 MED ORDER — HYDROCHLOROTHIAZIDE 12.5 MG PO TABS
12.5000 mg | ORAL_TABLET | Freq: Every morning | ORAL | 0 refills | Status: DC
  Filled 2022-09-06: qty 90, 90d supply, fill #0

## 2022-09-06 MED ORDER — LOSARTAN POTASSIUM 100 MG PO TABS
100.0000 mg | ORAL_TABLET | Freq: Every day | ORAL | 0 refills | Status: DC
  Filled 2022-09-06: qty 90, 90d supply, fill #0

## 2022-09-21 ENCOUNTER — Other Ambulatory Visit (HOSPITAL_COMMUNITY): Payer: Self-pay

## 2022-09-21 MED ORDER — AMOXICILLIN 500 MG PO CAPS
2000.0000 mg | ORAL_CAPSULE | ORAL | 1 refills | Status: DC
  Filled 2022-09-21: qty 16, 3d supply, fill #0
  Filled 2023-09-05: qty 16, 3d supply, fill #1

## 2022-10-13 ENCOUNTER — Other Ambulatory Visit (HOSPITAL_COMMUNITY): Payer: Self-pay

## 2022-10-26 ENCOUNTER — Other Ambulatory Visit (HOSPITAL_COMMUNITY): Payer: Self-pay

## 2022-10-27 ENCOUNTER — Other Ambulatory Visit (HOSPITAL_COMMUNITY): Payer: Self-pay

## 2022-10-27 MED ORDER — AMLODIPINE BESYLATE 5 MG PO TABS
5.0000 mg | ORAL_TABLET | Freq: Every day | ORAL | 0 refills | Status: DC
Start: 2022-10-27 — End: 2022-12-09
  Filled 2022-10-27: qty 90, 90d supply, fill #0

## 2022-11-30 ENCOUNTER — Other Ambulatory Visit (HOSPITAL_COMMUNITY): Payer: Self-pay

## 2022-12-06 ENCOUNTER — Other Ambulatory Visit (HOSPITAL_COMMUNITY): Payer: Self-pay

## 2022-12-06 ENCOUNTER — Other Ambulatory Visit (HOSPITAL_COMMUNITY): Payer: Self-pay | Admitting: Internal Medicine

## 2022-12-09 ENCOUNTER — Other Ambulatory Visit (HOSPITAL_COMMUNITY): Payer: Self-pay

## 2022-12-09 DIAGNOSIS — D509 Iron deficiency anemia, unspecified: Secondary | ICD-10-CM | POA: Diagnosis not present

## 2022-12-09 DIAGNOSIS — K219 Gastro-esophageal reflux disease without esophagitis: Secondary | ICD-10-CM | POA: Diagnosis not present

## 2022-12-09 DIAGNOSIS — E7800 Pure hypercholesterolemia, unspecified: Secondary | ICD-10-CM | POA: Diagnosis not present

## 2022-12-09 DIAGNOSIS — Z6841 Body Mass Index (BMI) 40.0 and over, adult: Secondary | ICD-10-CM | POA: Diagnosis not present

## 2022-12-09 DIAGNOSIS — S0591XA Unspecified injury of right eye and orbit, initial encounter: Secondary | ICD-10-CM | POA: Diagnosis not present

## 2022-12-09 DIAGNOSIS — I10 Essential (primary) hypertension: Secondary | ICD-10-CM | POA: Diagnosis not present

## 2022-12-09 DIAGNOSIS — H02843 Edema of right eye, unspecified eyelid: Secondary | ICD-10-CM | POA: Diagnosis not present

## 2022-12-09 MED ORDER — ATORVASTATIN CALCIUM 10 MG PO TABS
10.0000 mg | ORAL_TABLET | Freq: Every day | ORAL | 2 refills | Status: DC
  Filled 2022-12-09 – 2023-01-06 (×2): qty 90, 90d supply, fill #0
  Filled 2023-04-11: qty 90, 90d supply, fill #1
  Filled 2023-07-11: qty 90, 90d supply, fill #2

## 2022-12-09 MED ORDER — AMLODIPINE BESYLATE 5 MG PO TABS
5.0000 mg | ORAL_TABLET | Freq: Every day | ORAL | 2 refills | Status: DC
  Filled 2022-12-09 – 2023-01-06 (×2): qty 90, 90d supply, fill #0
  Filled 2023-04-11: qty 90, 90d supply, fill #1
  Filled 2023-07-11: qty 90, 90d supply, fill #2

## 2022-12-09 MED ORDER — PREDNISONE 20 MG PO TABS
ORAL_TABLET | ORAL | 0 refills | Status: AC
  Filled 2022-12-09: qty 12, 6d supply, fill #0

## 2022-12-09 MED ORDER — HYDROCHLOROTHIAZIDE 12.5 MG PO TABS
12.5000 mg | ORAL_TABLET | Freq: Every morning | ORAL | 2 refills | Status: DC
  Filled 2022-12-09: qty 90, 90d supply, fill #0
  Filled 2023-03-01: qty 90, 90d supply, fill #1
  Filled 2023-05-30: qty 90, 90d supply, fill #2

## 2022-12-09 MED ORDER — LOSARTAN POTASSIUM 100 MG PO TABS
100.0000 mg | ORAL_TABLET | Freq: Every day | ORAL | 2 refills | Status: DC
  Filled 2022-12-09: qty 90, 90d supply, fill #0
  Filled 2023-03-01: qty 90, 90d supply, fill #1
  Filled 2023-05-30: qty 90, 90d supply, fill #2

## 2023-01-06 ENCOUNTER — Other Ambulatory Visit (HOSPITAL_COMMUNITY): Payer: Self-pay

## 2023-01-07 ENCOUNTER — Other Ambulatory Visit (HOSPITAL_COMMUNITY): Payer: Self-pay

## 2023-01-07 MED ORDER — PANTOPRAZOLE SODIUM 40 MG PO TBEC
40.0000 mg | DELAYED_RELEASE_TABLET | Freq: Every day | ORAL | 1 refills | Status: DC
  Filled 2023-01-07: qty 90, 90d supply, fill #0
  Filled 2023-04-11: qty 90, 90d supply, fill #1

## 2023-04-11 ENCOUNTER — Other Ambulatory Visit (HOSPITAL_COMMUNITY): Payer: Self-pay

## 2023-04-12 ENCOUNTER — Other Ambulatory Visit (HOSPITAL_COMMUNITY): Payer: Self-pay

## 2023-04-13 ENCOUNTER — Other Ambulatory Visit (HOSPITAL_COMMUNITY): Payer: Self-pay

## 2023-04-20 DIAGNOSIS — H5203 Hypermetropia, bilateral: Secondary | ICD-10-CM | POA: Diagnosis not present

## 2023-04-20 DIAGNOSIS — H524 Presbyopia: Secondary | ICD-10-CM | POA: Diagnosis not present

## 2023-07-11 ENCOUNTER — Other Ambulatory Visit (HOSPITAL_COMMUNITY): Payer: Self-pay

## 2023-07-12 ENCOUNTER — Other Ambulatory Visit (HOSPITAL_COMMUNITY): Payer: Self-pay

## 2023-07-12 MED ORDER — PANTOPRAZOLE SODIUM 40 MG PO TBEC
40.0000 mg | DELAYED_RELEASE_TABLET | Freq: Every day | ORAL | 3 refills | Status: DC
  Filled 2023-07-12: qty 90, 90d supply, fill #0
  Filled 2023-10-04: qty 90, 90d supply, fill #1
  Filled 2024-01-16: qty 90, 90d supply, fill #2
  Filled 2024-04-10: qty 90, 90d supply, fill #3

## 2023-07-27 DIAGNOSIS — Z1231 Encounter for screening mammogram for malignant neoplasm of breast: Secondary | ICD-10-CM | POA: Diagnosis not present

## 2023-08-10 DIAGNOSIS — D2262 Melanocytic nevi of left upper limb, including shoulder: Secondary | ICD-10-CM | POA: Diagnosis not present

## 2023-08-10 DIAGNOSIS — L720 Epidermal cyst: Secondary | ICD-10-CM | POA: Diagnosis not present

## 2023-08-10 DIAGNOSIS — D225 Melanocytic nevi of trunk: Secondary | ICD-10-CM | POA: Diagnosis not present

## 2023-08-10 DIAGNOSIS — L814 Other melanin hyperpigmentation: Secondary | ICD-10-CM | POA: Diagnosis not present

## 2023-08-10 DIAGNOSIS — Z872 Personal history of diseases of the skin and subcutaneous tissue: Secondary | ICD-10-CM | POA: Diagnosis not present

## 2023-08-10 DIAGNOSIS — L821 Other seborrheic keratosis: Secondary | ICD-10-CM | POA: Diagnosis not present

## 2023-08-15 DIAGNOSIS — K589 Irritable bowel syndrome without diarrhea: Secondary | ICD-10-CM | POA: Diagnosis not present

## 2023-08-15 DIAGNOSIS — R7301 Impaired fasting glucose: Secondary | ICD-10-CM | POA: Diagnosis not present

## 2023-08-15 DIAGNOSIS — J309 Allergic rhinitis, unspecified: Secondary | ICD-10-CM | POA: Diagnosis not present

## 2023-08-15 DIAGNOSIS — I10 Essential (primary) hypertension: Secondary | ICD-10-CM | POA: Diagnosis not present

## 2023-08-15 DIAGNOSIS — Z0000 Encounter for general adult medical examination without abnormal findings: Secondary | ICD-10-CM | POA: Diagnosis not present

## 2023-08-15 DIAGNOSIS — D509 Iron deficiency anemia, unspecified: Secondary | ICD-10-CM | POA: Diagnosis not present

## 2023-08-15 DIAGNOSIS — E7800 Pure hypercholesterolemia, unspecified: Secondary | ICD-10-CM | POA: Diagnosis not present

## 2023-08-15 DIAGNOSIS — K219 Gastro-esophageal reflux disease without esophagitis: Secondary | ICD-10-CM | POA: Diagnosis not present

## 2023-08-15 DIAGNOSIS — N951 Menopausal and female climacteric states: Secondary | ICD-10-CM | POA: Diagnosis not present

## 2023-09-05 ENCOUNTER — Other Ambulatory Visit (HOSPITAL_COMMUNITY): Payer: Self-pay

## 2023-09-06 ENCOUNTER — Other Ambulatory Visit (HOSPITAL_COMMUNITY): Payer: Self-pay

## 2023-09-06 MED ORDER — HYDROCHLOROTHIAZIDE 12.5 MG PO TABS
12.5000 mg | ORAL_TABLET | Freq: Every morning | ORAL | 3 refills | Status: DC
  Filled 2023-09-06: qty 90, 90d supply, fill #0
  Filled 2023-11-30: qty 90, 90d supply, fill #1
  Filled 2024-02-26: qty 90, 90d supply, fill #2
  Filled 2024-05-27: qty 90, 90d supply, fill #3

## 2023-09-06 MED ORDER — LOSARTAN POTASSIUM 100 MG PO TABS
100.0000 mg | ORAL_TABLET | Freq: Every day | ORAL | 3 refills | Status: DC
  Filled 2023-09-06: qty 90, 90d supply, fill #0
  Filled 2023-11-30: qty 90, 90d supply, fill #1
  Filled 2024-02-26: qty 90, 90d supply, fill #2
  Filled 2024-05-27: qty 90, 90d supply, fill #3

## 2023-10-04 ENCOUNTER — Other Ambulatory Visit (HOSPITAL_COMMUNITY): Payer: Self-pay

## 2023-10-04 MED ORDER — ATORVASTATIN CALCIUM 10 MG PO TABS
10.0000 mg | ORAL_TABLET | Freq: Every day | ORAL | 2 refills | Status: DC
  Filled 2023-10-04: qty 90, 90d supply, fill #0
  Filled 2024-01-16: qty 90, 90d supply, fill #1
  Filled 2024-04-10: qty 90, 90d supply, fill #2

## 2023-10-04 MED ORDER — AMLODIPINE BESYLATE 5 MG PO TABS
5.0000 mg | ORAL_TABLET | Freq: Every day | ORAL | 2 refills | Status: DC
  Filled 2023-10-04: qty 90, 90d supply, fill #0
  Filled 2024-01-16: qty 90, 90d supply, fill #1
  Filled 2024-04-10: qty 90, 90d supply, fill #2

## 2023-10-11 ENCOUNTER — Ambulatory Visit (INDEPENDENT_AMBULATORY_CARE_PROVIDER_SITE_OTHER): Payer: 59 | Admitting: Adult Health

## 2023-10-11 ENCOUNTER — Encounter (INDEPENDENT_AMBULATORY_CARE_PROVIDER_SITE_OTHER): Payer: Self-pay | Admitting: Adult Health

## 2023-10-11 DIAGNOSIS — Z6841 Body Mass Index (BMI) 40.0 and over, adult: Secondary | ICD-10-CM

## 2023-10-11 DIAGNOSIS — Z0289 Encounter for other administrative examinations: Secondary | ICD-10-CM

## 2023-10-11 DIAGNOSIS — I10 Essential (primary) hypertension: Secondary | ICD-10-CM

## 2023-10-11 NOTE — Progress Notes (Signed)
Office: [phone]  /  Fax: [phone]   Initial Visit  Allison Boyle was seen in clinic today to evaluate for obesity. She is interested in losing weight to improve overall health and reduce the risk of weight related complications. She presents today to review program treatment options, initial physical assessment, and evaluation.     She was referred by: Self-Referral  When asked what else they would like to accomplish? She states: Adopt healthier eating patterns, Improve energy levels and physical activity, Improve existing medical conditions, Reduce number of medications, Improve appearance, Improve self-confidence, and Lose a target amount of weight : 100 lbs  Weight history: Weight gain steadily after menopause  When asked how has your weight affected you? She states: Contributed to medical problems, Contributed to orthopedic problems or mobility issues, Having fatigue, Having poor endurance, and Problems with eating patterns  Some associated conditions: Hypertension and Hyperlipidemia  Contributing factors: Nutritional, Stress, Reduced physical activity, Eating patterns, and Menopause  Weight promoting medications identified: Allegra  Current nutrition plan: None  Current level of physical activity: None  Current or previous pharmacotherapy: None  Response to medication: Never tried medications   Past medical history includes:   Past Medical History:  Diagnosis Date   Anemia    GERD (gastroesophageal reflux disease)    Hypertension    Seasonal allergies      Objective:   BP 137/83   Pulse 87   Temp 97.6 F (36.4 C)   Ht 5\' 5"  (1.651 m)   Wt 255 lb (115.7 kg)   SpO2 100%   BMI 42.43 kg/m  She was weighed on the bioimpedance scale: Body mass index is 42.43 kg/m.  Peak Weight:255 , Body Fat%:52.2, Visceral Fat Rating:17, Weight trend over the last 12 months: Unchanged  General:  Alert, oriented and cooperative. Patient is in no acute distress.   Respiratory: Normal respiratory effort, no problems with respiration noted   Gait: able to ambulate independently  Mental Status: Normal mood and affect. Normal behavior. Normal judgment and thought content.   DIAGNOSTIC DATA REVIEWED:  BMET    Component Value Date/Time   NA 136 04/09/2015 1435   K 3.3 (L) 04/09/2015 1435   CL 104 04/09/2015 1435   CO2 25 04/09/2015 1435   GLUCOSE 138 (H) 04/09/2015 1435   BUN 16 04/09/2015 1435   CREATININE 0.76 04/09/2015 1435   CALCIUM 8.2 (L) 04/09/2015 1435   GFRNONAA >90 04/09/2015 1435   GFRAA >90 04/09/2015 1435   No results found for: "HGBA1C" No results found for: "INSULIN" CBC    Component Value Date/Time   WBC 6.1 04/09/2015 1435   RBC 4.37 04/09/2015 1435   HGB 10.0 (L) 04/09/2015 1435   HCT 33.1 (L) 04/09/2015 1435   PLT 243 04/09/2015 1435   MCV 75.7 (L) 04/09/2015 1435   MCH 22.9 (L) 04/09/2015 1435   MCHC 30.2 04/09/2015 1435   RDW 18.3 (H) 04/09/2015 1435   Iron/TIBC/Ferritin/ %Sat No results found for: "IRON", "TIBC", "FERRITIN", "IRONPCTSAT" Lipid Panel     Component Value Date/Time   CHOL 195 05/14/2009 1056   TRIG 75.0 05/14/2009 1056   HDL 57.40 05/14/2009 1056   CHOLHDL 3 05/14/2009 1056   VLDL 15.0 05/14/2009 1056   LDLCALC 123 (H) 05/14/2009 1056   Hepatic Function Panel     Component Value Date/Time   PROT 6.5 05/14/2009 1056   ALBUMIN 3.7 05/14/2009 1056   AST 20 05/14/2009 1056   ALT 15 05/14/2009 1056  ALKPHOS 46 05/14/2009 1056   BILITOT 0.8 05/14/2009 1056   BILIDIR 0.1 05/14/2009 1056      Component Value Date/Time   TSH 2.47 05/14/2009 1056     Assessment and Plan:   Morbid obesity (HCC), Starting BMI 42.43  Essential hypertension  ESTABLISH WITH [facility]   Obesity Treatment / Action Plan:  Patient will work on garnering support from family and friends to begin weight loss journey. Will work on eliminating or reducing the presence of highly palatable, calorie dense  foods in the home. Will complete provided nutritional and psychosocial assessment questionnaire before the next appointment. Will be scheduled for indirect calorimetry to determine resting energy expenditure in a fasting state.  This will allow [place] to create a reduced calorie, high-protein meal plan to promote loss of fat mass while preserving muscle mass. Counseled on the health benefits of losing 5%-15% of total body weight. Was counseled on nutritional approaches to weight loss and benefits of reducing processed foods and consuming plant-based foods and high quality protein as part of nutritional weight management. Was counseled on pharmacotherapy and role as an adjunct in weight management.   Obesity Education Performed Today:  She was weighed on the bioimpedance scale and results were discussed and documented in the synopsis.  We discussed obesity as a disease and the importance of a more detailed evaluation of all the factors contributing to the disease.  We discussed the importance of long term lifestyle changes which include nutrition, exercise and behavioral modifications as well as the importance of customizing this to her specific health and social needs.  We discussed the benefits of reaching a healthier weight to alleviate the symptoms of existing conditions and reduce the risks of the biomechanical, metabolic and psychological effects of obesity.  CARALENA HASCH appears to be in the action stage of change and states they are ready to start intensive lifestyle modifications and behavioral modifications.  30 minutes was spent today on this visit including the above counseling, pre-visit chart review, and post-visit documentation.  Reviewed by clinician on day of visit: allergies, medications, problem list, medical history, surgical history, family history, social history, and previous encounter notes pertinent to obesity diagnosis.   Shaneca Orne d. Valmai Vandenberghe, NP-C

## 2023-11-03 ENCOUNTER — Encounter (INDEPENDENT_AMBULATORY_CARE_PROVIDER_SITE_OTHER): Payer: Self-pay | Admitting: *Deleted

## 2023-11-07 ENCOUNTER — Encounter (INDEPENDENT_AMBULATORY_CARE_PROVIDER_SITE_OTHER): Payer: Self-pay

## 2023-11-08 ENCOUNTER — Encounter (INDEPENDENT_AMBULATORY_CARE_PROVIDER_SITE_OTHER): Payer: Self-pay | Admitting: Internal Medicine

## 2023-11-08 ENCOUNTER — Ambulatory Visit (INDEPENDENT_AMBULATORY_CARE_PROVIDER_SITE_OTHER): Payer: 59 | Admitting: Internal Medicine

## 2023-11-08 VITALS — BP 127/84 | HR 71 | Temp 98.0°F | Ht 65.0 in | Wt 254.0 lb

## 2023-11-08 DIAGNOSIS — E7800 Pure hypercholesterolemia, unspecified: Secondary | ICD-10-CM

## 2023-11-08 DIAGNOSIS — Z1331 Encounter for screening for depression: Secondary | ICD-10-CM

## 2023-11-08 DIAGNOSIS — R0602 Shortness of breath: Secondary | ICD-10-CM

## 2023-11-08 DIAGNOSIS — I10 Essential (primary) hypertension: Secondary | ICD-10-CM

## 2023-11-08 DIAGNOSIS — R5383 Other fatigue: Secondary | ICD-10-CM | POA: Diagnosis not present

## 2023-11-08 NOTE — Progress Notes (Signed)
Office: [phone]  /  Fax: [phone]   Subjective   Initial Visit  Allison Boyle (MR# [id number]) is a 57 y.o. female who presents for evaluation and treatment of obesity and related comorbidities. Current BMI is Body mass index is 42.27 kg/m. Allison Boyle has been struggling with her weight for many years and has been unsuccessful in either losing weight, maintaining weight loss, or reaching her healthy weight goal.  Allison Boyle is currently in the action stage of change and ready to dedicate time achieving and maintaining a healthier weight. Allison Boyle is interested in becoming our patient and working on intensive lifestyle modifications including (but not limited to) diet and exercise for weight loss.  When asked how their weight has affected their life and health, she states: Contributed to medical problems, Having fatigue, and Having poor endurance  When asked what else they would like to accomplish? She states: Adopt healthier eating patterns, Improve energy levels and physical activity, Improve existing medical conditions, Reduce number of medications, Improve appearance, Improve self-confidence, and Lose a target amount of weight : 100 lbs in 12 months.  Weight history:  She starting to note weight gain during : adulthood.  Life events associated with weight gain include : medical illness.   Other contributing factors: Menopause.  Their highest weight has been:  254 lbs.  Previous weight-loss programs : Weight Watchers.  Their maximum weight loss was:  60 lbs.  Their greatest challenge with dieting:  tracking and journaling .  Weight promoting medications identified: None  Current or previous pharmacotherapy: None and Is interested in pharmacotherapy.  Response to medication: Never tried medications  Nutritional History:  Current nutrition plan: None.  How often do they eat breakfast : everyday.  Number of times they eat per day: 6  What beverages do they drink: regular  soda 1-2 per week.   Use of artificial sweetners : Yes  Food intolerances or restrictions: none.  Food triggers: Stress, Boredom, and To help comfort.  Food cravings: Starches  Current level of physical activity: Walking and NEAT  Past medical history includes:   Past Medical History:  Diagnosis Date   Anemia    Back pain    Constipation    Food allergy    GERD (gastroesophageal reflux disease)    High cholesterol    Hypertension    IBS (irritable bowel syndrome)    Joint pain    Obesity    Seasonal allergies      Objective   BP 127/84   Pulse 71   Temp 98 F (36.7 C)   Ht 5\' 5"  (1.651 m)   Wt 254 lb (115.2 kg)   SpO2 98%   BMI 42.27 kg/m  She was weighed on the bioimpedance scale: Body mass index is 42.27 kg/m.    Anthropometrics:  Vitals Temp: 98 F (36.7 C) BP: 127/84 Pulse Rate: 71 SpO2: 98 %   Anthropometric Measurements Height: 5\' 5"  (1.651 m) Weight: 254 lb (115.2 kg) BMI (Calculated): 42.27 Peak Weight: 255 lb Waist Measurement : 49 inches   Body Composition  Body Fat %: 51.5 % Fat Mass (lbs): 131 lbs Muscle Mass (lbs): 117.2 lbs Total Body Water (lbs): 92.8 lbs Visceral Fat Rating : 17   Other Clinical Data Fasting: yes Labs: yes Today's Visit #: 1 Starting Date: 11/08/23    Physical Exam:  General: She is overweight, cooperative, alert, well developed, and in no acute distress. PSYCH: Has normal mood, affect and thought process.   HEENT: EOMI,  sclerae are anicteric. Lungs: Normal breathing effort, no conversational dyspnea. Extremities: No edema.  Neurologic: No gross sensory or motor deficits. No tremors or fasciculations noted.    Diagnostic Data Reviewed  EKG: Normal sinus rhythm, rate 71.  Indirect Calorimeter completed today shows a VO2 of 277 and a REE of 1915.  Her calculated basal metabolic rate is 1610 thus her resting energy expenditure slower than calculated. 1915 Depression Screen  Allison Boyle's PHQ-9 score  was: 9.      No data to display          Screening for Sleep Related Breathing Disorders  Allison Boyle admits to daytime somnolence and admits to waking up still tired. Patient has a history of symptoms of daytime fatigue. Allison Boyle generally gets 7 hours of sleep per night, and states that she has difficulty falling back asleep if awakened. Snoring is present. Apneic episodes are not present. Epworth Sleepiness Score is 7.   BMET    Component Value Date/Time   NA 136 04/09/2015 1435   K 3.3 (L) 04/09/2015 1435   CL 104 04/09/2015 1435   CO2 25 04/09/2015 1435   GLUCOSE 138 (H) 04/09/2015 1435   BUN 16 04/09/2015 1435   CREATININE 0.76 04/09/2015 1435   CALCIUM 8.2 (L) 04/09/2015 1435   GFRNONAA >90 04/09/2015 1435   GFRAA >90 04/09/2015 1435   No results found for: "HGBA1C" No results found for: "INSULIN" CBC    Component Value Date/Time   WBC 6.1 04/09/2015 1435   RBC 4.37 04/09/2015 1435   HGB 10.0 (L) 04/09/2015 1435   HCT 33.1 (L) 04/09/2015 1435   PLT 243 04/09/2015 1435   MCV 75.7 (L) 04/09/2015 1435   MCH 22.9 (L) 04/09/2015 1435   MCHC 30.2 04/09/2015 1435   RDW 18.3 (H) 04/09/2015 1435   Iron/TIBC/Ferritin/ %Sat No results found for: "IRON", "TIBC", "FERRITIN", "IRONPCTSAT" Lipid Panel     Component Value Date/Time   CHOL 195 05/14/2009 1056   TRIG 75.0 05/14/2009 1056   HDL 57.40 05/14/2009 1056   CHOLHDL 3 05/14/2009 1056   VLDL 15.0 05/14/2009 1056   LDLCALC 123 (H) 05/14/2009 1056   Hepatic Function Panel     Component Value Date/Time   PROT 6.5 05/14/2009 1056   ALBUMIN 3.7 05/14/2009 1056   AST 20 05/14/2009 1056   ALT 15 05/14/2009 1056   ALKPHOS 46 05/14/2009 1056   BILITOT 0.8 05/14/2009 1056   BILIDIR 0.1 05/14/2009 1056      Component Value Date/Time   TSH 2.47 05/14/2009 1056     Assessment and Plan   TREATMENT PLAN FOR OBESITY:  Recommended Dietary Goals  [name] is currently in the action stage of change. As such, her goal is to  implement medically supervised weight loss plan.  She has agreed to implement: the Category 3 plan - 1500 kcal per day  Behavioral Intervention  We discussed the following Behavioral Modification Strategies today: increasing lean protein intake to established goals, decreasing simple carbohydrates , increasing vegetables, increasing lower glycemic fruits, increasing fiber rich foods, avoiding skipping meals, increasing water intake, work on meal planning and preparation, reading food labels , keeping healthy foods at home, identifying sources and decreasing liquid calories, decreasing eating out or consumption of processed foods, and making healthy choices when eating convenient foods, planning for success, and better snacking choices  Additional resources provided today:  Category 3 packet  Recommended Physical Activity Goals  Allison Boyle has been advised to work up to 150 minutes of moderate  intensity aerobic activity a week and strengthening exercises 2-3 times per week for cardiovascular health, weight loss maintenance and preservation of muscle mass.   She has agreed to :  Think about enjoyable ways to increase daily physical activity and overcoming barriers to exercise and Increase physical activity in their day and reduce sedentary time (increase NEAT).  Pharmacotherapy We will work on building a Therapist, art and behavioral strategies. We will discuss the role of pharmacotherapy as an adjunct at subsequent visits.   ASSOCIATED CONDITIONS ADDRESSED TODAY  Other Fatigue  Allison Boyle admits to daytime somnolence and admits to waking up still tired. Patient has a history of symptoms of daytime fatigue. Allison Boyle generally gets 7 hours of sleep per night, and states that she has difficulty falling back asleep if awakened. Snoring is present. Apneic episodes are not present. Epworth Sleepiness Score is 7.  Allison Boyle does feel that her weight is causing her energy to be lower than it  should be. Fatigue may be related to obesity, depression or many other causes. Labs will be ordered, and in the meanwhile, Mertis will focus on self care including making healthy food choices, increasing physical activity and focusing on stress reduction.  Shortness of Breath Allison Boyle notes increasing shortness of breath with exercising and seems to be worsening over time with weight gain. She notes getting out of breath sooner with activity than she used to. This has not gotten worse recently. Allison Boyle denies shortness of breath at rest or orthopnea.Allison Boyle notes increasing shortness of breath with exercising and seems to be worsening over time with weight gain. She notes getting out of breath sooner with activity than she used to. This has not gotten worse recently. Allison Boyle denies shortness of breath at rest or orthopnea.  Other fatigue -     EKG 12-Lead  SOB (shortness of breath) on exertion  Depression screen    Follow-up  She was informed of the importance of frequent follow-up visits to maximize her success with intensive lifestyle modifications for her multiple health conditions. She was informed we would discuss her lab results at her next visit unless there is a critical issue that needs to be addressed sooner. Allison Boyle agreed to keep her next visit at the agreed upon time to discuss these results.  Investment banker, operational .   This is the patient's first visit at [facility]. The patient's Health Questionnaire was reviewed at length. Included in the packet: current and past health history, medications, allergies, ROS, gynecologic history (women only), surgical history, family history, social history, weight history, weight loss surgery history (for those that have had weight loss surgery), nutritional evaluation, mood and food questionnaire, PHQ9, Epworth questionnaire, sleep habits questionnaire, patient life and health improvement goals questionnaire. These will all be scanned into  the patient's chart under media.   During the visit, I independently reviewed the patient's EKG, bioimpedance scale results, and indirect calorimeter results. I used this information to tailor a meal plan for the patient that will help her to lose weight and will improve her obesity-related conditions going forward. I performed a medically necessary appropriate examination and/or evaluation. I discussed the assessment and treatment plan with the patient. The patient was provided an opportunity to ask questions and all were answered. The patient agreed with the plan and demonstrated an understanding of the instructions. Labs were ordered at this visit and will be reviewed at the next visit unless more critical results need to be addressed immediately. Clinical information was updated and  documented in the EMR.   Time spent on visit including pre-visit chart review and post-visit care was 60 minutes.     Worthy Rancher, MD

## 2023-11-08 NOTE — Assessment & Plan Note (Signed)
I reviewed most recent labs on patient's phone she had an LDL of 133 and total cholesterol of 220.  She is currently on atorvastatin 10 mg once a day and may need a higher dose for cardiovascular risk reduction.  Patient also counseled on reducing saturated fats to less than 10% of calories.  Losing 10% of body weight may improve condition.  She will discuss intensification of therapy with PCP

## 2023-11-08 NOTE — Assessment & Plan Note (Signed)
Blood pressure is close to goal of less than 130/80.  She is currently on losartan 100 mg once a day hydrochlorothiazide 12.5 mg once a day amlodipine 5 mg once a day without any adverse effects.  I reviewed most recent renal parameters and shows normal kidney function and electrolytes.  She will continue current regimen.  Losing 10% of body weight may improve condition.

## 2023-11-09 LAB — TSH: TSH: 2.27 u[IU]/mL (ref 0.450–4.500)

## 2023-11-09 LAB — VITAMIN B12: Vitamin B-12: 333 pg/mL (ref 232–1245)

## 2023-11-09 LAB — VITAMIN D 25 HYDROXY (VIT D DEFICIENCY, FRACTURES): Vit D, 25-Hydroxy: 59.8 ng/mL (ref 30.0–100.0)

## 2023-11-09 LAB — INSULIN, RANDOM: INSULIN: 10.5 u[IU]/mL (ref 2.6–24.9)

## 2023-11-22 ENCOUNTER — Ambulatory Visit (INDEPENDENT_AMBULATORY_CARE_PROVIDER_SITE_OTHER): Payer: Self-pay | Admitting: Internal Medicine

## 2023-11-22 ENCOUNTER — Encounter (INDEPENDENT_AMBULATORY_CARE_PROVIDER_SITE_OTHER): Payer: Self-pay | Admitting: Internal Medicine

## 2023-11-22 VITALS — BP 127/83 | HR 73 | Temp 97.9°F | Ht 65.0 in | Wt 250.0 lb

## 2023-11-22 DIAGNOSIS — I10 Essential (primary) hypertension: Secondary | ICD-10-CM | POA: Diagnosis not present

## 2023-11-22 DIAGNOSIS — E7800 Pure hypercholesterolemia, unspecified: Secondary | ICD-10-CM

## 2023-11-22 DIAGNOSIS — Z6841 Body Mass Index (BMI) 40.0 and over, adult: Secondary | ICD-10-CM

## 2023-11-22 DIAGNOSIS — R5383 Other fatigue: Secondary | ICD-10-CM | POA: Diagnosis not present

## 2023-11-22 NOTE — Assessment & Plan Note (Signed)
I reviewed labs at [facility] physicians her LDL cholesterol was 130.  I calculated her 10-year cardiovascular risk which is 3.95%.  She is currently on atorvastatin 10 mg daily and has noted muscle aches and joint pain so she sometimes skips medication.

## 2023-11-22 NOTE — Progress Notes (Signed)
Office: [phone]  /  Fax: [phone]  Weight Summary And Biometrics  Vitals Temp: 97.9 F (36.6 C) BP: 127/83 Pulse Rate: 73 SpO2: 98 %   Anthropometric Measurements Height: 5\' 5"  (1.651 m) Weight: 250 lb (113.4 kg) BMI (Calculated): 41.6 Weight at Last Visit: 254 lb Weight Lost Since Last Visit: 4 lb Weight Gained Since Last Visit: 0 lb Starting Weight: 254 lb Total Weight Loss (lbs): 4 lb (1.814 kg) Peak Weight: 255 lb   Body Composition  Body Fat %: 50.8 % Fat Mass (lbs): 127.4 lbs Muscle Mass (lbs): 117.2 lbs Total Body Water (lbs): 93 lbs Visceral Fat Rating : 17    RMR: 1915  Today's Visit #: 2  Starting Date: 11/08/23   Subjective   Chief Complaint: Obesity  Allison Boyle is here to discuss her progress with her obesity treatment plan. She is on the the Category 3 Plan and states she is following her eating plan approximately 95 % of the time. She states she is active.  Interval History:   Discussed the use of AI scribe software for clinical note transcription with the patient, who gave verbal consent to proceed.  History of Present Illness    Patient presents today for follow-up on weight management.  She reports a significant challenge in consuming the recommended amount of protein, particularly meat. She describes feeling overwhelmed by the quantity, often experiencing a gag reflex due to the volume of meat. The patient has been attempting to adjust to the new dietary requirements but finds it particularly difficult to consume the recommended 6 ounces of meat at dinner. Despite these challenges, the patient acknowledges a noticeable decrease in hunger and increased satiety since increasing her protein intake.  The patient also reports a history of muscle aches and bone pain, which she attributes to her current cholesterol medication, Atorvastatin. She mentions occasionally skipping doses when the discomfort becomes too severe. The patient is also on  Pantoprazole, which she takes as needed, and a combination of Losartan, Amlodipine, and Hydrochlorothiazide for blood pressure management.  The patient has been successful in her weight management efforts, losing four pounds during the holiday season. She expresses a desire to continue her weight loss journey and is open to the possibility of weight loss medications, pending insurance coverage in the new year.   The patient is supplementing her diet with Vitamin D3, taking 5000 units daily. However, she was unaware that her current dosage equates to 5000 units and was under the impression that she was taking 125 milligrams. The patient is open to adjusting her Vitamin D3 intake to every other day, following the discovery that her Vitamin D levels are high normal.  The patient's blood work indicates a normal metabolism, with a resting metabolic rate of 2956 calories. To continue her weight loss, the patient understands the need to consume fewer calories than this metabolic rate. The patient's insulin levels were slightly elevated, suggesting possible insulin resistance. However, there were no indications of prediabetes. The patient's LDL cholesterol was slightly elevated at 133, and she is open to discussing a change in cholesterol medication with her primary care provider due to the muscle aches and bone pain she associates with Atorvastatin.      Orexigenic Control:  Denies problems with appetite and hunger signals.  Denies problems with satiety and satiation.  Denies problems with eating patterns and portion control.  Denies abnormal cravings. Denies feeling deprived or restricted.   Barriers identified: none.   Pharmacotherapy for weight loss:  She is currently taking no anti-obesity medication.   Assessment and Plan   Treatment Plan For Obesity:  Recommended Dietary Goals  Allison Boyle is currently in the action stage of change. As such, her goal is to continue weight management plan. She has  agreed to: continue current plan, she was advised to use mindfulness when eating and we do not want her to feel stuff I recommend that she reduce to 3 ounces with lunch and 4 to 5 ounces with dinner of protein.  Behavioral Intervention  We discussed the following Behavioral Modification Strategies today: continue to work on maintaining a reduced calorie state, getting the recommended amount of protein, incorporating whole foods, making healthy choices, staying well hydrated and practicing mindfulness when eating..  Additional resources provided today: Provided with personal guidance and instructions on how to use [employer] for healthy meal ideas and cooking in bulk.  Recommended Physical Activity Goals  Allison Boyle has been advised to work up to 150 minutes of moderate intensity aerobic activity a week and strengthening exercises 2-3 times per week for cardiovascular health, weight loss maintenance and preservation of muscle mass.   She has agreed to :  Think about enjoyable ways to increase daily physical activity and overcoming barriers to exercise and Increase physical activity in their day and reduce sedentary time (increase NEAT).  Pharmacotherapy  We discussed various medication options to help Allison Boyle with her weight loss efforts and we both agreed to : continue with nutritional and behavioral strategies  Associated Conditions Addressed Today  Pure hypercholesterolemia Assessment & Plan: I reviewed labs at [facility] physicians her LDL cholesterol was 130.  I calculated her 10-year cardiovascular risk which is 3.95%.  She is currently on atorvastatin 10 mg daily and has noted muscle aches and joint pain so she sometimes skips medication.   Essential hypertension Assessment & Plan: Blood pressure is at goal.  She is currently on losartan 100 mg once a day hydrochlorothiazide 12.5 mg once a day amlodipine 5 mg once a day without any adverse effects.  I reviewed most recent renal parameters  and shows normal kidney function but potassium is low normal.  This may contribute to muscle cramps.  She will continue current regimen.  Losing 10% of body weight may improve condition.  Recommend repeating BMP and magnesium in 4 weeks.   Morbid obesity (HCC) Assessment & Plan: See obesity treatment plan          Objective   Physical Exam:  Blood pressure 127/83, pulse 73, temperature 97.9 F (36.6 C), height 5\' 5"  (1.651 m), weight 250 lb (113.4 kg), last menstrual period 04/05/2015, SpO2 98%. Body mass index is 41.6 kg/m.  General: She is overweight, cooperative, alert, well developed, and in no acute distress. PSYCH: Has normal mood, affect and thought process.   HEENT: EOMI, sclerae are anicteric. Lungs: Normal breathing effort, no conversational dyspnea. Extremities: No edema.  Neurologic: No gross sensory or motor deficits. No tremors or fasciculations noted.    Diagnostic Data Reviewed:  BMET    Component Value Date/Time   NA 136 04/09/2015 1435   K 3.3 (L) 04/09/2015 1435   CL 104 04/09/2015 1435   CO2 25 04/09/2015 1435   GLUCOSE 138 (H) 04/09/2015 1435   BUN 16 04/09/2015 1435   CREATININE 0.76 04/09/2015 1435   CALCIUM 8.2 (L) 04/09/2015 1435   GFRNONAA >90 04/09/2015 1435   GFRAA >90 04/09/2015 1435   No results found for: "HGBA1C" Lab Results  Component Value Date  INSULIN 10.5 11/08/2023   Lab Results  Component Value Date   TSH 2.270 11/08/2023   CBC    Component Value Date/Time   WBC 6.1 04/09/2015 1435   RBC 4.37 04/09/2015 1435   HGB 10.0 (L) 04/09/2015 1435   HCT 33.1 (L) 04/09/2015 1435   PLT 243 04/09/2015 1435   MCV 75.7 (L) 04/09/2015 1435   MCH 22.9 (L) 04/09/2015 1435   MCHC 30.2 04/09/2015 1435   RDW 18.3 (H) 04/09/2015 1435   Iron Studies No results found for: "IRON", "TIBC", "FERRITIN", "IRONPCTSAT" Lipid Panel     Component Value Date/Time   CHOL 195 05/14/2009 1056   TRIG 75.0 05/14/2009 1056   HDL 57.40  05/14/2009 1056   CHOLHDL 3 05/14/2009 1056   VLDL 15.0 05/14/2009 1056   LDLCALC 123 (H) 05/14/2009 1056   Hepatic Function Panel     Component Value Date/Time   PROT 6.5 05/14/2009 1056   ALBUMIN 3.7 05/14/2009 1056   AST 20 05/14/2009 1056   ALT 15 05/14/2009 1056   ALKPHOS 46 05/14/2009 1056   BILITOT 0.8 05/14/2009 1056   BILIDIR 0.1 05/14/2009 1056      Component Value Date/Time   TSH 2.270 11/08/2023 1103   Nutritional Lab Results  Component Value Date   VD25OH 59.8 11/08/2023    Follow-Up   Return in about 4 weeks (around 12/20/2023) for For Weight Mangement with Dr. Rikki Spearing.Marland Kitchen She was informed of the importance of frequent follow up visits to maximize her success with intensive lifestyle modifications for her multiple health conditions.  Attestation Statement   Reviewed by clinician on day of visit: allergies, medications, problem list, medical history, surgical history, family history, social history, and previous encounter notes.   I have spent 40 minutes in the care of the patient today including: preparing to see patient (e.g. review and interpretation of tests, old notes ), obtaining and/or reviewing separately obtained history, performing a medically appropriate examination or evaluation, counseling and educating the patient, documenting clinical information in the electronic or other health care record, and independently interpreting results and communicating results to the patient, family, or caregiver   Worthy Rancher, MD

## 2023-11-22 NOTE — Assessment & Plan Note (Signed)
 See obesity treatment plan

## 2023-11-22 NOTE — Assessment & Plan Note (Signed)
Blood pressure is at goal.  She is currently on losartan 100 mg once a day hydrochlorothiazide 12.5 mg once a day amlodipine 5 mg once a day without any adverse effects.  I reviewed most recent renal parameters and shows normal kidney function but potassium is low normal.  This may contribute to muscle cramps.  She will continue current regimen.  Losing 10% of body weight may improve condition.  Recommend repeating BMP and magnesium in 4 weeks.

## 2023-11-30 ENCOUNTER — Other Ambulatory Visit (HOSPITAL_COMMUNITY): Payer: Self-pay

## 2023-12-28 ENCOUNTER — Ambulatory Visit (INDEPENDENT_AMBULATORY_CARE_PROVIDER_SITE_OTHER): Payer: 59 | Admitting: Internal Medicine

## 2023-12-28 ENCOUNTER — Encounter (INDEPENDENT_AMBULATORY_CARE_PROVIDER_SITE_OTHER): Payer: Self-pay | Admitting: Internal Medicine

## 2023-12-28 VITALS — BP 129/84 | HR 76 | Temp 98.1°F | Ht 65.0 in | Wt 246.0 lb

## 2023-12-28 DIAGNOSIS — I10 Essential (primary) hypertension: Secondary | ICD-10-CM

## 2023-12-28 DIAGNOSIS — Z6841 Body Mass Index (BMI) 40.0 and over, adult: Secondary | ICD-10-CM | POA: Diagnosis not present

## 2023-12-28 DIAGNOSIS — E7800 Pure hypercholesterolemia, unspecified: Secondary | ICD-10-CM | POA: Diagnosis not present

## 2023-12-28 DIAGNOSIS — E66813 Obesity, class 3: Secondary | ICD-10-CM | POA: Diagnosis not present

## 2023-12-28 NOTE — Progress Notes (Signed)
 Office: [phone]  /  Fax: [phone]  Weight Summary And Biometrics  Vitals Temp: 98.1 F (36.7 C) BP: 129/84 Pulse Rate: 76 SpO2: 98 %   Anthropometric Measurements Height: 5' 5 (1.651 m) Weight: 246 lb (111.6 kg) BMI (Calculated): 40.94 Weight at Last Visit: 250 lb Weight Lost Since Last Visit: 4 lb Weight Gained Since Last Visit: 0 lb Starting Weight: 254 lb Total Weight Loss (lbs): 8 lb (3.629 kg) Peak Weight: 255 lb   Body Composition  Body Fat %: 50 % Fat Mass (lbs): 123.2 lbs Muscle Mass (lbs): 116.8 lbs Total Body Water (lbs): 91.2 lbs Visceral Fat Rating : 16    RMR: 1915  Today's Visit #: 3  Starting Date: 11/08/23   Subjective   Chief Complaint: Obesity  Allison Boyle is here to discuss Allison Boyle progress with Allison Boyle obesity treatment plan. Allison Boyle is on Allison Allison Category 3 Plan and states Allison Boyle is following Allison Boyle eating plan approximately 90 % of Allison time. Allison Boyle states Allison Boyle is exercising 20-30 minutes 6-7 times per week.  Interval History:   Discussed Allison use of AI scribe software for clinical note transcription with Allison Boyle, who gave verbal consent to proceed.  History of Present Illness   Allison Boyle, affected by obesity, hypercholesterolemia, and hypertension, presents for a medical weight management consultation. Allison Boyle reports adherence to a reduced-calorie nutrition plan approximately 90% of Allison time and engages in 20-30 minutes of exercise 6-7 times per week. Since Allison last office visit, Allison Boyle has lost four pounds.  Allison Boyle expresses difficulty with consuming Allison recommended amount of protein, particularly from meat sources. Allison Boyle expresses interest in protein drinks as an alternative. Allison Boyle has tried protein bars in Allison past but found them unpalatable. Allison Boyle also reports consuming yogurt, particularly Greek yogurt, as a snack to manage occasional hunger pangs.  Allison Boyle's exercise regimen primarily consists of walking and using Allison treadmill. Allison Boyle  reports occasional joint pain, particularly in Allison knees, back, and hips, which Allison Boyle attributes to aging. Allison Boyle hopes this discomfort will decrease as Allison Boyle continues to lose weight.  Allison Boyle reports a mindful approach to Allison Boyle diet, avoiding keeping tempting foods such as cookies and cake in Allison house. Allison Boyle admits to occasional indulgences, such as a slice of cake once a week or alcohol consumption. Allison Boyle does not feel deprived by Allison Boyle current dietary regimen and does not express a constant feeling of hunger.  Allison Boyle has lost nine pounds since starting Allison Boyle weight management plan, with a decrease in body fat percentage and visceral fat rating. Allison Boyle has maintained Allison Boyle muscle mass during this period. Allison Boyle expresses satisfaction with Allison Boyle progress and does not feel a current need for appetite suppressant medications. However, Allison Boyle is open to considering such medications in Allison future if necessary.       Orexigenic Control:  Denies problems with appetite and hunger signals.  Denies problems with satiety and satiation.  Denies problems with eating patterns and portion control.  Denies abnormal cravings. Denies feeling deprived or restricted.   Barriers identified: none.   Pharmacotherapy for weight loss: Allison Boyle is currently taking no anti-obesity medication.   Assessment and Plan   Treatment Plan For Obesity:  Recommended Dietary Goals  Allison Boyle is currently in Allison action stage of change. As such, Allison Boyle goal is to continue weight management plan. Allison Boyle has agreed to: continue current plan  Behavioral Intervention  We discussed Allison following Behavioral Modification Strategies today: continue to work on maintaining a reduced calorie  state, getting Allison recommended amount of protein, incorporating whole foods, making healthy choices, staying well hydrated and practicing mindfulness when eating..  Additional resources provided today: None  Recommended Physical Activity Goals  Allison Boyle has been advised to  work up to 150 minutes of moderate intensity aerobic activity a week and strengthening exercises 2-3 times per week for cardiovascular health, weight loss maintenance and preservation of muscle mass.   Allison Boyle has agreed to :  Think about enjoyable ways to increase daily physical activity and overcoming barriers to exercise and Increase physical activity in their day and reduce sedentary time (increase NEAT).  Pharmacotherapy  We discussed various medication options to help Allison Boyle with Allison Boyle weight loss efforts and we both agreed to : continue with nutritional and behavioral strategies  Associated Conditions Addressed and Impacted by Obesity Treatment  Essential hypertension Assessment & Plan: Blood pressure is at goal.  Allison Boyle is currently on losartan  100 mg once a day hydrochlorothiazide  12.5 mg once a day amlodipine  5 mg once a day without any adverse effects.  I reviewed most recent renal parameters and shows normal kidney function but potassium is low normal.  This may contribute to muscle cramps.  Allison Boyle will continue current regimen.  Losing 10% of body weight may improve condition.  Recommend repeating BMP and magnesium in 4 weeks.   Pure hypercholesterolemia Assessment & Plan: I reviewed labs at [facility] physicians Allison Boyle LDL cholesterol was 130.  I calculated Allison Boyle 10-year cardiovascular risk which is 3.95%.  Allison Boyle is currently on atorvastatin  10 mg daily and has noted muscle aches and joint pain so Allison Boyle sometimes skips medication.  Continue statin therapy.   Class 3 severe obesity with serious comorbidity and body mass index (BMI) of 40.0 to 44.9 in adult, unspecified obesity type [facility]) Assessment & Plan: Boyle presents for medical weight management. Allison Boyle has been following a reduced calorie nutrition plan 90% of Allison time and exercising 20-30 minutes, 6-7 times per week. Since Allison last visit, Allison Boyle has lost 4 pounds, bringing Allison Boyle weight down to 246 pounds from 255 pounds in October 2024. Allison Boyle reports  difficulty with protein intake, particularly with meat, and is interested in protein drinks and bars. Allison Boyle is maintaining muscle mass and has a decreasing body fat percentage and visceral fat rating. Discussed Allison potential use of GLP-1 medications for appetite suppression and weight loss. Explained that GLP-1 medications work by decreasing hunger and slowing digestion, which can lead to feeling full with less food intake. Highlighted that these medications do not change metabolism or melt fat away, and that rapid weight loss can lead to muscle loss. Boyle currently feels Allison Boyle does not need medication as Allison Boyle is not experiencing significant hunger. Emphasized Allison importance of gradual weight loss for sustainability and muscle preservation.   - Continue current reduced calorie nutrition plan   - Increase protein intake through protein drinks, bars, and other sources like Greek yogurt, [employer], and Nisource   - Consider protein balls as a snack option   - Continue current exercise regimen, increasing volume or intensity weekly   - Follow up in 3 weeks        Objective   Physical Exam:  Blood pressure 129/84, pulse 76, temperature 98.1 F (36.7 C), height 5' 5 (1.651 m), weight 246 lb (111.6 kg), last menstrual period 04/05/2015, SpO2 98%. Body mass index is 40.94 kg/m.  General: Allison Boyle is overweight, cooperative, alert, well developed, and in no acute distress. PSYCH: Has normal mood, affect and thought process.  HEENT: EOMI, sclerae are anicteric. Lungs: Normal breathing effort, no conversational dyspnea. Extremities: No edema.  Neurologic: No gross sensory or motor deficits. No tremors or fasciculations noted.    Diagnostic Data Reviewed:  BMET    Component Value Date/Time   NA 136 04/09/2015 1435   K 3.3 (L) 04/09/2015 1435   CL 104 04/09/2015 1435   CO2 25 04/09/2015 1435   GLUCOSE 138 (H) 04/09/2015 1435   BUN 16 04/09/2015 1435   CREATININE 0.76 04/09/2015 1435   CALCIUM  8.2 (L)  04/09/2015 1435   GFRNONAA >90 04/09/2015 1435   GFRAA >90 04/09/2015 1435   No results found for: HGBA1C Lab Results  Component Value Date   INSULIN  10.5 11/08/2023   Lab Results  Component Value Date   TSH 2.270 11/08/2023   CBC    Component Value Date/Time   WBC 6.1 04/09/2015 1435   RBC 4.37 04/09/2015 1435   HGB 10.0 (L) 04/09/2015 1435   HCT 33.1 (L) 04/09/2015 1435   PLT 243 04/09/2015 1435   MCV 75.7 (L) 04/09/2015 1435   MCH 22.9 (L) 04/09/2015 1435   MCHC 30.2 04/09/2015 1435   RDW 18.3 (H) 04/09/2015 1435   Iron Studies No results found for: IRON, TIBC, FERRITIN, IRONPCTSAT Lipid Panel     Component Value Date/Time   CHOL 195 05/14/2009 1056   TRIG 75.0 05/14/2009 1056   HDL 57.40 05/14/2009 1056   CHOLHDL 3 05/14/2009 1056   VLDL 15.0 05/14/2009 1056   LDLCALC 123 (H) 05/14/2009 1056   Hepatic Function Panel     Component Value Date/Time   PROT 6.5 05/14/2009 1056   ALBUMIN 3.7 05/14/2009 1056   AST 20 05/14/2009 1056   ALT 15 05/14/2009 1056   ALKPHOS 46 05/14/2009 1056   BILITOT 0.8 05/14/2009 1056   BILIDIR 0.1 05/14/2009 1056      Component Value Date/Time   TSH 2.270 11/08/2023 1103   Nutritional Lab Results  Component Value Date   VD25OH 59.8 11/08/2023    Follow-Up   Return in about 3 weeks (around 01/18/2024) for For Weight Mangement with Dr. Francyne.SABRA Allison Boyle was informed of Allison importance of frequent follow up visits to maximize Allison Boyle success with intensive lifestyle modifications for Allison Boyle multiple health conditions.  Attestation Statement   Reviewed by clinician on day of visit: allergies, medications, problem list, medical history, surgical history, family history, social history, and previous encounter notes.        I have spent 30 minutes in Allison care of Allison Boyle today including: preparing to see Boyle (e.g. review and interpretation of tests, old notes ), obtaining and/or reviewing separately obtained history,  performing a medically appropriate examination or evaluation, counseling and educating Allison Boyle, documenting clinical information in Allison electronic or other health care record, and independently interpreting results and communicating results to Allison Boyle, family, or caregiver   Lucas Francyne, MD

## 2023-12-29 NOTE — Assessment & Plan Note (Signed)
 I reviewed labs at [facility] physicians her LDL cholesterol was 130.  I calculated her 10-year cardiovascular risk which is 3.95%.  She is currently on atorvastatin  10 mg daily and has noted muscle aches and joint pain so she sometimes skips medication.  Continue statin therapy.

## 2023-12-29 NOTE — Assessment & Plan Note (Signed)
 Patient presents for medical weight management. She has been following a reduced calorie nutrition plan 90% of the time and exercising 20-30 minutes, 6-7 times per week. Since the last visit, she has lost 4 pounds, bringing her weight down to 246 pounds from 255 pounds in October 2024. She reports difficulty with protein intake, particularly with meat, and is interested in protein drinks and bars. She is maintaining muscle mass and has a decreasing body fat percentage and visceral fat rating. Discussed the potential use of GLP-1 medications for appetite suppression and weight loss. Explained that GLP-1 medications work by decreasing hunger and slowing digestion, which can lead to feeling full with less food intake. Highlighted that these medications do not change metabolism or melt fat away, and that rapid weight loss can lead to muscle loss. Patient currently feels she does not need medication as she is not experiencing significant hunger. Emphasized the importance of gradual weight loss for sustainability and muscle preservation.   - Continue current reduced calorie nutrition plan   - Increase protein intake through protein drinks, bars, and other sources like Greek yogurt, Skyr, and Nisource   - Consider protein balls as a snack option   - Continue current exercise regimen, increasing volume or intensity weekly   - Follow up in 3 weeks

## 2023-12-29 NOTE — Assessment & Plan Note (Signed)
 Blood pressure is at goal.  She is currently on losartan 100 mg once a day hydrochlorothiazide 12.5 mg once a day amlodipine 5 mg once a day without any adverse effects.  I reviewed most recent renal parameters and shows normal kidney function but potassium is low normal.  This may contribute to muscle cramps.  She will continue current regimen.  Losing 10% of body weight may improve condition.  Recommend repeating BMP and magnesium in 4 weeks.

## 2024-01-24 ENCOUNTER — Ambulatory Visit (INDEPENDENT_AMBULATORY_CARE_PROVIDER_SITE_OTHER): Payer: 59 | Admitting: Internal Medicine

## 2024-02-15 DIAGNOSIS — E7800 Pure hypercholesterolemia, unspecified: Secondary | ICD-10-CM | POA: Diagnosis not present

## 2024-02-15 DIAGNOSIS — K219 Gastro-esophageal reflux disease without esophagitis: Secondary | ICD-10-CM | POA: Diagnosis not present

## 2024-02-15 DIAGNOSIS — I10 Essential (primary) hypertension: Secondary | ICD-10-CM | POA: Diagnosis not present

## 2024-02-21 ENCOUNTER — Ambulatory Visit (INDEPENDENT_AMBULATORY_CARE_PROVIDER_SITE_OTHER): Payer: 59 | Admitting: Internal Medicine

## 2024-03-27 ENCOUNTER — Ambulatory Visit (INDEPENDENT_AMBULATORY_CARE_PROVIDER_SITE_OTHER): Admitting: Internal Medicine

## 2024-04-10 ENCOUNTER — Encounter (INDEPENDENT_AMBULATORY_CARE_PROVIDER_SITE_OTHER): Payer: Self-pay | Admitting: Internal Medicine

## 2024-04-10 ENCOUNTER — Ambulatory Visit (INDEPENDENT_AMBULATORY_CARE_PROVIDER_SITE_OTHER): Admitting: Internal Medicine

## 2024-04-10 VITALS — BP 137/83 | HR 74 | Temp 98.3°F | Ht 65.0 in | Wt 246.0 lb

## 2024-04-10 DIAGNOSIS — I10 Essential (primary) hypertension: Secondary | ICD-10-CM | POA: Diagnosis not present

## 2024-04-10 DIAGNOSIS — Z6841 Body Mass Index (BMI) 40.0 and over, adult: Secondary | ICD-10-CM | POA: Diagnosis not present

## 2024-04-10 DIAGNOSIS — E7800 Pure hypercholesterolemia, unspecified: Secondary | ICD-10-CM | POA: Diagnosis not present

## 2024-04-10 DIAGNOSIS — E66813 Obesity, class 3: Secondary | ICD-10-CM | POA: Diagnosis not present

## 2024-04-10 NOTE — Progress Notes (Signed)
 Office: [phone]  /  Fax: [phone]  Weight Summary And Biometrics  Vitals Temp: 98.3 F (36.8 C) BP: 137/83 Pulse Rate: 74 SpO2: 97 %   Anthropometric Measurements Height: 5\' 5"  (1.651 m) Weight: 246 lb (111.6 kg) BMI (Calculated): 40.94 Weight at Last Visit: 246 lb Weight Lost Since Last Visit: 0 lb Weight Gained Since Last Visit: 0lb Starting Weight: 254 lb Total Weight Loss (lbs): 8 lb (3.629 kg) Peak Weight: 255 lb   Body Composition  Body Fat %: 50.5 % Fat Mass (lbs): 124.6 lbs Muscle Mass (lbs): 115.8 lbs Total Body Water (lbs): 94.2 lbs Visceral Fat Rating : 16    No data recorded Today's Visit #: 4  Starting Date: 11/08/23   Subjective   Chief Complaint: Obesity  Interval History Discussed the use of AI scribe software for clinical note transcription with the patient, who gave verbal consent to proceed.  History of Present Illness   Allison Boyle is a 58 year old female with hypercholesterolemia and essential hypertension who presents for medical weight management.  She has a history of hypercholesterolemia and essential hypertension. Her last LDL cholesterol was 130 mg/dL, and her calculated ten-year cardiovascular risk was 3.95%. She is currently taking atorvastatin  10 mg daily  She has maintained her weight since her last visit approximately two months ago. She follows a 1500 calorie meal plan about 70-80% of the time, incorporating more whole foods but not tracking calories. She reports not getting the correct amount of protein, maintaining adequate hydration, and occasionally skipping meals.  She exercises six days a week for about 30 minutes each session. When attending appointments every four weeks, she was losing about one pound per week, but now her weight has plateaued at 245 pounds. Her weight fluctuates slightly but generally maintains at this level.  She does not track her calories consistently and relies on intuitive eating,  avoiding sweets, pasta, and pizza. She has tried journaling in the past but finds it burdensome, especially on weekends. She occasionally skips breakfast and eats a larger lunch, often when working in the yard.  She has explored various tracking apps but has not found one that suits her needs. She finds it challenging to consume adequate protein and dislikes beans and oatmeal, which limits her high-fiber and high-protein food options. She prefers whole wheat pasta, bread, and occasionally eats brown rice and quinoa.      Challenges affecting patient progress: work schedule, limited food variation or intolerances, and menopause.    Pharmacotherapy for weight management: She is currently taking no anti-obesity medication.   Assessment and Plan   Treatment Plan For Obesity:  Recommended Dietary Goals  Allison Boyle is currently in the action stage of change. As such, her goal is to continue weight management plan. She has agreed to: keep a food journal with a target of  1500 calories per day and 90-120 grams of protein per day or 30-40 grams per meal. and continue current plan  Behavioral Health and Counseling  We discussed the following behavioral modification strategies today: continue to work on maintaining a reduced calorie state, getting the recommended amount of protein, incorporating whole foods, making healthy choices, staying well hydrated and practicing mindfulness when eating..  Additional education and resources provided today: Handout and personalized instruction on tracking and journaling using Apps  Recommended Physical Activity Goals  Allison Boyle has been advised to work up to 150 minutes of moderate intensity aerobic activity a week and strengthening exercises 2-3 times per week  for cardiovascular health, weight loss maintenance and preservation of muscle mass.   She has agreed to :  Think about enjoyable ways to increase daily physical activity and overcoming barriers to exercise and  Increase physical activity in their day and reduce sedentary time (increase NEAT).  Pharmacotherapy  We discussed various medication options to help Allison Boyle with her weight loss efforts and we both agreed to :  She was provided with a list of antiobesity medications I would like for her to review Qsymia as a potential option she will let me know if interested at the next office visit  Associated Conditions Impacted by Obesity Treatment  Essential hypertension Assessment & Plan: Blood pressure is close to goal.  She is currently on losartan  100 mg once a day hydrochlorothiazide  12.5 mg once a day amlodipine  5 mg once a day without any adverse effects.  Continue current regimen.  Losing 10% of body weight may improve blood pressure control   Class 3 severe obesity with serious comorbidity and body mass index (BMI) of 40.0 to 44.9 in adult, unspecified obesity type [facility]) Assessment & Plan: She has maintained over the last 3 months.  I would like for her to start tracking and journaling to determine if she has diet resistant obesity.  She has been relying more on intuitive eating.  She is familiar with the plate method we also discussed mindfulness eating practices.  We will consider initiating antiobesity medications at the next visit.  GLP-1 drugs at present time are cost prohibitive.   Pure hypercholesterolemia Assessment & Plan: I reviewed labs at [facility] physicians her LDL cholesterol was 130.  I calculated her 10-year cardiovascular risk which is 3.95%.  She is currently on atorvastatin  10 mg daily and has noted muscle aches and joint pain so she sometimes skips medication.  Continue statin therapy.              Objective   Physical Exam:  Blood pressure 137/83, pulse 74, temperature 98.3 F (36.8 C), height 5\' 5"  (1.651 m), weight 246 lb (111.6 kg), last menstrual period 04/05/2015, SpO2 97%. Body mass index is 40.94 kg/m.  General: She is overweight, cooperative, alert, well  developed, and in no acute distress. PSYCH: Has normal mood, affect and thought process.   HEENT: EOMI, sclerae are anicteric. Lungs: Normal breathing effort, no conversational dyspnea. Extremities: No edema.  Neurologic: No gross sensory or motor deficits. No tremors or fasciculations noted.    Diagnostic Data Reviewed:  BMET    Component Value Date/Time   NA 136 04/09/2015 1435   K 3.3 (L) 04/09/2015 1435   CL 104 04/09/2015 1435   CO2 25 04/09/2015 1435   GLUCOSE 138 (H) 04/09/2015 1435   BUN 16 04/09/2015 1435   CREATININE 0.76 04/09/2015 1435   CALCIUM  8.2 (L) 04/09/2015 1435   GFRNONAA >90 04/09/2015 1435   GFRAA >90 04/09/2015 1435   No results found for: "HGBA1C" Lab Results  Component Value Date   INSULIN  10.5 11/08/2023   Lab Results  Component Value Date   TSH 2.270 11/08/2023   CBC    Component Value Date/Time   WBC 6.1 04/09/2015 1435   RBC 4.37 04/09/2015 1435   HGB 10.0 (L) 04/09/2015 1435   HCT 33.1 (L) 04/09/2015 1435   PLT 243 04/09/2015 1435   MCV 75.7 (L) 04/09/2015 1435   MCH 22.9 (L) 04/09/2015 1435   MCHC 30.2 04/09/2015 1435   RDW 18.3 (H) 04/09/2015 1435   Iron Studies No  results found for: "IRON", "TIBC", "FERRITIN", "IRONPCTSAT" Lipid Panel     Component Value Date/Time   CHOL 195 05/14/2009 1056   TRIG 75.0 05/14/2009 1056   HDL 57.40 05/14/2009 1056   CHOLHDL 3 05/14/2009 1056   VLDL 15.0 05/14/2009 1056   LDLCALC 123 (H) 05/14/2009 1056   Hepatic Function Panel     Component Value Date/Time   PROT 6.5 05/14/2009 1056   ALBUMIN 3.7 05/14/2009 1056   AST 20 05/14/2009 1056   ALT 15 05/14/2009 1056   ALKPHOS 46 05/14/2009 1056   BILITOT 0.8 05/14/2009 1056   BILIDIR 0.1 05/14/2009 1056      Component Value Date/Time   TSH 2.270 11/08/2023 1103   Nutritional Lab Results  Component Value Date   VD25OH 59.8 11/08/2023    Medications: Outpatient Encounter Medications as of 04/10/2024  Medication Sig   amLODipine   (NORVASC ) 5 MG tablet Take 1 tablet (5 mg total) by mouth daily.   atorvastatin  (LIPITOR) 10 MG tablet Take 1 tablet (10 mg total) by mouth daily.   fexofenadine (ALLEGRA) 180 MG tablet Take 180 mg by mouth at bedtime as needed for allergies or rhinitis.   hydrochlorothiazide  (HYDRODIURIL ) 12.5 MG tablet Take 1 tablet (12.5 mg total) by mouth in the morning.   Hyoscyamine  Sulfate SL 0.125 MG SUBL Dissolve 1 tablet under the tongue or take 1 tablet by mouth up to every 6 hours as needed for abdominal symtoms   iron polysaccharides (NIFEREX) 150 MG capsule Take 150 mg by mouth daily.   losartan  (COZAAR ) 100 MG tablet Take 1 tablet (100 mg total) by mouth daily.   Multiple Vitamins-Minerals (AIRBORNE PO) Take by mouth 2 (two) times daily.   Multiple Vitamins-Minerals (MULTIVITAMIN PO) Take 1 tablet by mouth daily.   Omega-3 Fatty Acids (FISH OIL) 1000 MG CAPS Take 1 capsule by mouth daily.   pantoprazole  (PROTONIX ) 40 MG tablet Take 1 tablet (40 mg total) by mouth daily.   Turmeric (QC TUMERIC COMPLEX PO) Take by mouth daily.   Vitamin D , Ergocalciferol , (DRISDOL) 1.25 MG (50000 UNIT) CAPS capsule Take by mouth.   Zoster Vaccine Adjuvanted (SHINGRIX ) injection To be administered by Pharmacist   amLODipine  (NORVASC ) 5 MG tablet TAKE 1 TABLET BY MOUTH ONCE DAILY   atorvastatin  (LIPITOR) 10 MG tablet TAKE 1 TABLET BY MOUTH ONCE DAILY   pantoprazole  (PROTONIX ) 40 MG tablet TAKE 1 TABLET BY MOUTH DAILY   No facility-administered encounter medications on file as of 04/10/2024.     Follow-Up   Return in about 5 weeks (around 05/15/2024) for For Weight Mangement with Dr. Allie Area.Aaron Aas She was informed of the importance of frequent follow up visits to maximize her success with intensive lifestyle modifications for her multiple health conditions.  Attestation Statement   Reviewed by clinician on day of visit: allergies, medications, problem list, medical history, surgical history, family history, social  history, and previous encounter notes.     Ladd Picker, MD

## 2024-04-10 NOTE — Assessment & Plan Note (Signed)
 She has maintained over the last 3 months.  I would like for her to start tracking and journaling to determine if she has diet resistant obesity.  She has been relying more on intuitive eating.  She is familiar with the plate method we also discussed mindfulness eating practices.  We will consider initiating antiobesity medications at the next visit.  GLP-1 drugs at present time are cost prohibitive.

## 2024-04-10 NOTE — Assessment & Plan Note (Signed)
 I reviewed labs at [facility] physicians her LDL cholesterol was 130.  I calculated her 10-year cardiovascular risk which is 3.95%.  She is currently on atorvastatin  10 mg daily and has noted muscle aches and joint pain so she sometimes skips medication.  Continue statin therapy.

## 2024-04-10 NOTE — Assessment & Plan Note (Signed)
 Blood pressure is close to goal.  She is currently on losartan  100 mg once a day hydrochlorothiazide  12.5 mg once a day amlodipine  5 mg once a day without any adverse effects.  Continue current regimen.  Losing 10% of body weight may improve blood pressure control

## 2024-06-06 ENCOUNTER — Ambulatory Visit (INDEPENDENT_AMBULATORY_CARE_PROVIDER_SITE_OTHER): Admitting: Internal Medicine

## 2024-06-20 ENCOUNTER — Ambulatory Visit
Admission: RE | Admit: 2024-06-20 | Discharge: 2024-06-20 | Disposition: A | Discharge status: Home / Self Care | Source: Ambulatory Visit | Attending: Family Medicine | Admitting: Family Medicine

## 2024-06-20 ENCOUNTER — Encounter

## 2024-06-20 VITALS — BP 141/88 | HR 91 | Temp 98.5°F | Resp 18

## 2024-06-20 DIAGNOSIS — L239 Allergic contact dermatitis, unspecified cause: Secondary | ICD-10-CM

## 2024-06-20 MED ORDER — CLOBETASOL PROPIONATE 0.05 % EX OINT: 1.0000 | TOPICAL_OINTMENT | Freq: Two times a day (BID) | CUTANEOUS | 0 refills | Status: DC

## 2024-06-20 NOTE — Discharge Instructions (Signed)
 Continue a daily antihistamine, and apply the topical steroid cream to the area.  You may apply a patch bandage or other dressing to occlude the area to allow the medicine to penetrate better if desired.  If continuing to spread or not improving follow-up for a recheck.

## 2024-06-20 NOTE — ED Triage Notes (Signed)
 Pt reports rash on right leg, states she is unsure if I is a tick bite or pouison ivy,

## 2024-06-20 NOTE — ED Provider Notes (Signed)
 [facility]    CSN: [id number] Arrival date & time: 06/20/24  1731      History   Chief Complaint Chief Complaint  Patient presents with   Poison Ivy    poison ivy or tick bite reaction, no sure I was bitten by a tick and also shortly after exposed to poison ivy?? - Entered by patient    HPI Allison Boyle is a 58 y.o. female.   Patient presenting today with rash to the right inner leg near the right knee that has progressed over the past 2 weeks.  She states she had a tick bite in this area and then possibly another type of bite days after that and the area of redness and swelling has expanded.  Denies significant itching, spread elsewhere, throat itching or swelling, chest tightness, new soaps or products used, new foods or medications.  Trying antihistamines with minimal relief.    Past Medical History:  Diagnosis Date   Anemia    Back pain    Constipation    Food allergy    GERD (gastroesophageal reflux disease)    High cholesterol    Hypertension    IBS (irritable bowel syndrome)    Joint pain    Obesity    Seasonal allergies     Patient Active Problem List   Diagnosis Date Noted   Class 3 severe obesity with serious comorbidity and body mass index (BMI) of 40.0 to 44.9 in adult 12/29/2023   Pure hypercholesterolemia 11/08/2023   Essential hypertension 11/08/2023   GERD 11/28/2007   IBS 11/28/2007   CHEST PAIN 11/28/2007    Past Surgical History:  Procedure Laterality Date   DILITATION & CURRETTAGE/HYSTROSCOPY WITH HYDROTHERMAL ABLATION N/A 04/17/2015   Procedure: DILATATION & CURETTAGE/HYSTEROSCOPY WITH HYDROTHERMAL ABLATION;  Surgeon: Delon Prude, DO;  Location: [facility];  Service: Gynecology;  Laterality: N/A;   WISDOM TOOTH EXTRACTION     bottom only    OB History     Gravida  0   Para  0   Term  0   Preterm  0   AB  0   Living  0      SAB  0   IAB  0   Ectopic  0   Multiple  0   Live Births  0            Home  Medications    Prior to Admission medications   Medication Sig Start Date End Date Taking? Authorizing Provider  clobetasol ointment (TEMOVATE) 0.05 % Apply 1 Application topically 2 (two) times daily. 06/20/24  Yes Stuart Vernell Norris, PA-C  amLODipine  (NORVASC ) 5 MG tablet TAKE 1 TABLET BY MOUTH ONCE DAILY 07/21/20 07/21/21  Signa Dire, MD  amLODipine  (NORVASC ) 5 MG tablet Take 1 tablet (5 mg total) by mouth daily. 10/04/23     atorvastatin  (LIPITOR) 10 MG tablet TAKE 1 TABLET BY MOUTH ONCE DAILY 06/11/20 06/11/21  Signa Dire, MD  atorvastatin  (LIPITOR) 10 MG tablet Take 1 tablet (10 mg total) by mouth daily. 10/04/23     fexofenadine (ALLEGRA) 180 MG tablet Take 180 mg by mouth at bedtime as needed for allergies or rhinitis.    [provider]  hydrochlorothiazide  (HYDRODIURIL ) 12.5 MG tablet Take 1 tablet (12.5 mg total) by mouth in the morning. 09/06/23     Hyoscyamine  Sulfate SL 0.125 MG SUBL Dissolve 1 tablet under the tongue or take 1 tablet by mouth up to every 6 hours as needed for  abdominal symtoms 04/08/21     iron polysaccharides (NIFEREX) 150 MG capsule Take 150 mg by mouth daily.    [provider]  losartan  (COZAAR ) 100 MG tablet Take 1 tablet (100 mg total) by mouth daily. 09/06/23     Multiple Vitamins-Minerals (AIRBORNE PO) Take by mouth 2 (two) times daily.    [provider]  Multiple Vitamins-Minerals (MULTIVITAMIN PO) Take 1 tablet by mouth daily.    [provider]  Omega-3 Fatty Acids (FISH OIL) 1000 MG CAPS Take 1 capsule by mouth daily.    [provider]  pantoprazole  (PROTONIX ) 40 MG tablet TAKE 1 TABLET BY MOUTH DAILY 06/11/20 06/11/21  Signa Dire, MD  pantoprazole  (PROTONIX ) 40 MG tablet Take 1 tablet (40 mg total) by mouth daily. 07/12/23     Turmeric (QC TUMERIC COMPLEX PO) Take by mouth daily.    [provider]  Vitamin D , Ergocalciferol , (DRISDOL) 1.25 MG (50000 UNIT) CAPS capsule Take by mouth.     [provider]  Zoster Vaccine Adjuvanted (SHINGRIX ) injection To be administered by Pharmacist 07/21/21   Luiz Channel, MD    Family History Family History  Problem Relation Age of Onset   High blood pressure Mother    Thyroid  disease Mother    Diabetes Father    High blood pressure Father    High Cholesterol Father    Heart disease Father    Stroke Father     Social History Social History   Tobacco Use   Smoking status: Former    Current packs/day: 0.00    Average packs/day: 0.5 packs/day for 25.0 years (12.5 ttl pk-yrs)    Types: Cigarettes    Start date: 12/20/1978    Quit date: 12/21/2003    Years since quitting: 20.5   Smokeless tobacco: Never  Substance Use Topics   Alcohol use: Yes    Comment: socially   Drug use: No     Allergies   Poison ivy extract and Strawberry extract   Review of Systems Review of Systems PER HPI  Physical Exam Triage Vital Signs ED Triage Vitals  Encounter Vitals Group     BP 06/20/24 1733 (!) 141/88     Girls Systolic BP Percentile --      Girls Diastolic BP Percentile --      Boys Systolic BP Percentile --      Boys Diastolic BP Percentile --      Pulse Rate 06/20/24 1733 91     Resp 06/20/24 1733 18     Temp 06/20/24 1733 98.5 F (36.9 C)     Temp Source 06/20/24 1733 Oral     SpO2 06/20/24 1733 97 %     Weight --      Height --      Head Circumference --      Peak Flow --      Pain Score 06/20/24 1736 0     Pain Loc --      Pain Education --      Exclude from Growth Chart --    No data found.  Updated Vital Signs BP (!) 141/88 (BP Location: Right Arm)   Pulse 91   Temp 98.5 F (36.9 C) (Oral)   Resp 18   LMP 04/05/2015 (Approximate)   SpO2 97%   Visual Acuity Right Eye Distance:   Left Eye Distance:   Bilateral Distance:    Right Eye Near:   Left Eye Near:    Bilateral Near:  Physical Exam Vitals and nursing note reviewed.  Constitutional:      Appearance: Normal appearance. She is  not ill-appearing.  HENT:     Head: Atraumatic.  Eyes:     Extraocular Movements: Extraocular movements intact.     Conjunctiva/sclera: Conjunctivae normal.  Cardiovascular:     Rate and Rhythm: Normal rate and regular rhythm.     Heart sounds: Normal heart sounds.  Pulmonary:     Effort: Pulmonary effort is normal.     Breath sounds: Normal breath sounds.  Musculoskeletal:        General: Normal range of motion.     Cervical back: Normal range of motion and neck supple.  Skin:    General: Skin is warm and dry.     Comments: Area of erythematous maculopapular rash surrounding an insect bite type papular lesion to the right medial knee region.  No fluctuance, induration, localized edema, bleeding or drainage  Neurological:     Mental Status: She is alert and oriented to person, place, and time.  Psychiatric:        Mood and Affect: Mood normal.        Thought Content: Thought content normal.        Judgment: Judgment normal.      UC Treatments / Results  Labs (all labs ordered are listed, but only abnormal results are displayed) Labs Reviewed - No data to display  EKG   Radiology No results found.  Procedures Procedures (including critical care time)  Medications Ordered in UC Medications - No data to display  Initial Impression / Assessment and Plan / UC Course  I have reviewed the triage vital signs and the nursing notes.  Pertinent labs & imaging results that were available during my care of the patient were reviewed by me and considered in my medical decision making (see chart for details).     Suspect allergic site reaction to an insect bite.  Treat with clobetasol ointment, antihistamines, supportive home care.  Return for worsening or unresolving symptoms. Final Clinical Impressions(s) / UC Diagnoses   Final diagnoses:  Allergic dermatitis     Discharge Instructions      Continue a daily antihistamine, and apply the topical steroid cream to the  area.  You may apply a patch bandage or other dressing to occlude the area to allow the medicine to penetrate better if desired.  If continuing to spread or not improving follow-up for a recheck.    ED Prescriptions     Medication Sig Dispense Auth. Provider   clobetasol ointment (TEMOVATE) 0.05 % Apply 1 Application topically 2 (two) times daily. 80 g Stuart Vernell Norris, [place]      PDMP not reviewed this encounter.   Stuart Vernell Norris, [place] 06/20/24 [phone]

## 2024-07-09 ENCOUNTER — Other Ambulatory Visit (HOSPITAL_COMMUNITY): Payer: Self-pay

## 2024-07-09 ENCOUNTER — Other Ambulatory Visit: Payer: Self-pay

## 2024-07-09 MED ORDER — ATORVASTATIN CALCIUM 10 MG PO TABS
10.0000 mg | ORAL_TABLET | Freq: Every day | ORAL | 3 refills | Status: AC
  Filled 2024-07-09: qty 90, 90d supply, fill #0

## 2024-07-09 MED ORDER — AMLODIPINE BESYLATE 5 MG PO TABS
5.0000 mg | ORAL_TABLET | Freq: Every day | ORAL | 3 refills | Status: AC
  Filled 2024-07-09: qty 90, 90d supply, fill #0
  Filled 2024-10-08: qty 90, 90d supply, fill #1
  Filled 2025-01-07: qty 90, 90d supply, fill #2

## 2024-07-09 MED ORDER — PANTOPRAZOLE SODIUM 40 MG PO TBEC
40.0000 mg | DELAYED_RELEASE_TABLET | Freq: Every day | ORAL | 3 refills | Status: AC
  Filled 2024-07-09: qty 90, 90d supply, fill #0
  Filled 2024-10-08: qty 90, 90d supply, fill #1
  Filled 2025-01-07: qty 90, 90d supply, fill #2

## 2024-08-07 DIAGNOSIS — Z1231 Encounter for screening mammogram for malignant neoplasm of breast: Secondary | ICD-10-CM | POA: Diagnosis not present

## 2024-08-15 DIAGNOSIS — D2262 Melanocytic nevi of left upper limb, including shoulder: Secondary | ICD-10-CM | POA: Diagnosis not present

## 2024-08-15 DIAGNOSIS — L814 Other melanin hyperpigmentation: Secondary | ICD-10-CM | POA: Diagnosis not present

## 2024-08-15 DIAGNOSIS — D225 Melanocytic nevi of trunk: Secondary | ICD-10-CM | POA: Diagnosis not present

## 2024-08-15 DIAGNOSIS — L821 Other seborrheic keratosis: Secondary | ICD-10-CM | POA: Diagnosis not present

## 2024-08-15 DIAGNOSIS — Z872 Personal history of diseases of the skin and subcutaneous tissue: Secondary | ICD-10-CM | POA: Diagnosis not present

## 2024-08-21 DIAGNOSIS — D509 Iron deficiency anemia, unspecified: Secondary | ICD-10-CM | POA: Diagnosis not present

## 2024-08-21 DIAGNOSIS — K219 Gastro-esophageal reflux disease without esophagitis: Secondary | ICD-10-CM | POA: Diagnosis not present

## 2024-08-21 DIAGNOSIS — Z6841 Body Mass Index (BMI) 40.0 and over, adult: Secondary | ICD-10-CM | POA: Diagnosis not present

## 2024-08-21 DIAGNOSIS — J309 Allergic rhinitis, unspecified: Secondary | ICD-10-CM | POA: Diagnosis not present

## 2024-08-21 DIAGNOSIS — Z0000 Encounter for general adult medical examination without abnormal findings: Secondary | ICD-10-CM | POA: Diagnosis not present

## 2024-08-21 DIAGNOSIS — K589 Irritable bowel syndrome without diarrhea: Secondary | ICD-10-CM | POA: Diagnosis not present

## 2024-08-21 DIAGNOSIS — E7800 Pure hypercholesterolemia, unspecified: Secondary | ICD-10-CM | POA: Diagnosis not present

## 2024-08-21 DIAGNOSIS — N951 Menopausal and female climacteric states: Secondary | ICD-10-CM | POA: Diagnosis not present

## 2024-08-21 DIAGNOSIS — I10 Essential (primary) hypertension: Secondary | ICD-10-CM | POA: Diagnosis not present

## 2024-08-21 DIAGNOSIS — Z124 Encounter for screening for malignant neoplasm of cervix: Secondary | ICD-10-CM | POA: Diagnosis not present

## 2024-08-22 NOTE — Unmapped External Note (Signed)
 Assessment Note   Demographics Verification - Call Monitoring - Confidentiality      Member Verification: Member Verification    Call Monitoring Disclaimer: Call Monitoring Disclaimer         Person Providing Info on the Call   Who is the person providing the information on the call? Member      Limitations/Preferences   What, if any, physical limitations, health literacy, language needs and learning preferences do you have that I should be aware of when we talk?        Specify other language limitations        Specify other physical limitations        LCC Contact Type   [facility] Your Way contact type: 4th or more contact-Telephonic Chronic Condition     New Hypertension Adult                          General Health Perception   How would you describe your health in general?        SMART GOAL & SMART GOAL Follow Up     SMART GOAL    Did the member set a SMART goal? Yes  Did the member meet their previous SMART goal?  No      Brief Summary of Member Status   What medical conditions has your doctor diagnosed you with?    Past Medical History:  Diagnosis Date  . High cholesterol   . Hypertension      Did the member score via [employer] Engine for any condition(s) that he/she did NOT confirm?    HYW:  What medical conditions are you most concerned about and why?    In between MD visits people often find a need to go to the ER or an Urgent Care facility.can you tell me if you had any concerns or issues in which you went to an ER or Urgent care in the last 12 months?      Condition Specific Metrics   Height/Weight/BMI  / /    Blood Pressure BP: 126/80  Outcome Metrics      Medications    Current Outpatient Medications:  .  amLODIPine  (NORVASC ) 5 MG tablet, Take 5 mg by mouth daily., Disp: , Rfl:  .  atorvastatin  calcium  (ATORVASTATIN  ORAL), Take 20 mg by mouth, Disp: , Rfl:  .  ergocalciferol  (Vitamin D2) 1,250 mcg  (50,000 unit) capsule, Take 50,000 Units by mouth once a week., Disp: , Rfl:  .  hydroCHLOROthiazide  (MICROZIDE ) 12.5 mg capsule, Take 12.5 mg by mouth daily., Disp: , Rfl:  .  losartan  (COZAAR ) 100 MG tablet, Take 100 mg by mouth daily., Disp: , Rfl:  .  multivitamin capsule, Take 1 capsule by mouth daily., Disp: , Rfl:  .  mv-mn/C/glutamin/lysin/herb124 (AIRBORNE, ASCORBATE SODIUM, ORAL), Take by mouth., Disp: , Rfl:  .  pantoprazole  (PROTONIX ) 40 MG tablet, Take 40 mg by mouth daily., Disp: , Rfl:  .  TURMERIC ORAL, Take by mouth., Disp: , Rfl:     Medications Discontinued During This Encounter  Medication Reason  . atorvastatin  (LIPITOR) 10 MG tablet        Medication Review   Medication Review  Does the member meet any of the following criteria to trigger a medication review?: More than 3 months since last medication review ASK IF FULL MEDICATION REVIEW INDICATED: Is there any medication that your doctor has prescribed for you that you have never filled?:  No ASK IF FULL MEDICATION REVIEW INDICATED: What issues, if any, sometimes interfere with your taking your medications on time or as prescribed?: No issues     Depression Screening PHQ2/PHQ9 Exclusions   PHQ2 EXCLUSION CRITERIA:  Do NOT administer the PHQ-2 if any of the following apply to this member.   PHQ9 EXCLUSION CRITERIA: To determine if this assessment is right for you, I need to ask you some questions before we start.  Besides depression, have you been diagnosed with any other mental health issues such as: No applicable exclusions              Depression PHQ2/PHQ9 Screening Results     PHQ2  The PHQ-2 questions are scored from 0 (not at all) to 3 (nearly every day) and then added together: Score 0-2 = Not likely to be at risk for depression, Score 3-6 = At risk for depression (further screening recommended). PHQ 2 Risk Score: 0 (08/22/2024  2:47 PM)       PHQ9 PHQ-9 Mini Module-If diagnosis of  depression- show confirmed dx of depression and show PHQ-9 score & score description        Social Drivers of Health   Tobacco Use: Medium Risk (06/20/2024)   Received [facility]   Patient History   . Smoking Tobacco Use: Former   . Smokeless Tobacco Use: Never      Lab Results   Results     There are no results available from this visit.         Immunizations   Immunizations Mini Module (show all Q/A pairs answered during this encounter) Immunization (Ages 2 -70 )Have you received a pneumovax vaccine (pneumonia shot)? You may have received this in your doctor's office, during a hospital discharge, pharmacy, clinic, etc.: Yes     Intervention/Actions   Education Topics Discussed -     Document the hypertension education and interventions provided on this call:  KEY TOPIC - Lifestyle and blood pressure; Exercise and blood pressure          Referrals & Referral Follow Up   Referrals    Flowsheet Row Most Recent Value  Please document any referrals to other programs or resources that were made by the staff: None During This Contact            MEP/Mobile App Registration & Education on Tools/Resources   Is the member registered on the Member Engagement Platform (MEP) or Mobile App?  Explain tools and resources available on Member Engagement Platform [facility]) and how to get the most benefit out of them.  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 chol high. Increased chol to 20 mg.  08-21-2024 labs: hdl 49, trigl 227,  ldl 115    potassium was low. 3.3.   will increase potassium with diet per md . aware of high potassium foods Reviewed labs and how to improve with diet and exercise. Reviewed healthy fats and watching carbs and sweets for trigl Repeat labs in dec Bp 126/80  Reviewed possible Muscle pain from  increased statin dose Discussed importance of potassium for heart health Glucose: 96  Has been trying to stretch when able . sometimes gets too busy at work. Sits at desk all day  Doesn't eat a lot of bread or sweets,   Drinks alcohol  on wknds Doing some sort of exercise 5 days a wk. treadmill, walk, yardwork  Goal: from now until next call, cont Walk or treadmill 20 min 4 days a wk  on top of other yardwork  .   Poc: htn, warning sx,

## 2024-08-27 ENCOUNTER — Other Ambulatory Visit (HOSPITAL_COMMUNITY): Payer: Self-pay

## 2024-08-27 MED ORDER — LOSARTAN POTASSIUM 100 MG PO TABS
100.0000 mg | ORAL_TABLET | Freq: Every day | ORAL | 1 refills | Status: AC
  Filled 2024-08-27: qty 90, 90d supply, fill #0
  Filled 2024-11-21: qty 90, 90d supply, fill #1

## 2024-08-27 MED ORDER — HYDROCHLOROTHIAZIDE 12.5 MG PO TABS
12.5000 mg | ORAL_TABLET | Freq: Every morning | ORAL | 3 refills | Status: AC
  Filled 2024-08-27: qty 90, 90d supply, fill #0
  Filled 2024-11-21: qty 90, 90d supply, fill #1

## 2024-08-28 ENCOUNTER — Encounter (INDEPENDENT_AMBULATORY_CARE_PROVIDER_SITE_OTHER): Payer: Self-pay | Admitting: *Deleted

## 2024-09-20 ENCOUNTER — Other Ambulatory Visit (HOSPITAL_COMMUNITY): Payer: Self-pay

## 2024-09-20 MED ORDER — FLUZONE 0.5 ML IM SUSY
0.5000 mL | PREFILLED_SYRINGE | Freq: Once | INTRAMUSCULAR | 0 refills | Status: AC
  Filled 2024-09-20: qty 0.5, 1d supply, fill #0

## 2024-09-24 ENCOUNTER — Telehealth: Payer: Self-pay

## 2024-09-24 DIAGNOSIS — N920 Excessive and frequent menstruation with regular cycle: Secondary | ICD-10-CM | POA: Insufficient documentation

## 2024-09-24 DIAGNOSIS — N950 Postmenopausal bleeding: Secondary | ICD-10-CM | POA: Insufficient documentation

## 2024-09-24 DIAGNOSIS — Z780 Asymptomatic menopausal state: Secondary | ICD-10-CM | POA: Insufficient documentation

## 2024-09-24 DIAGNOSIS — D509 Iron deficiency anemia, unspecified: Secondary | ICD-10-CM | POA: Insufficient documentation

## 2024-09-24 DIAGNOSIS — R7301 Impaired fasting glucose: Secondary | ICD-10-CM | POA: Insufficient documentation

## 2024-09-24 DIAGNOSIS — J309 Allergic rhinitis, unspecified: Secondary | ICD-10-CM | POA: Insufficient documentation

## 2024-09-24 NOTE — Telephone Encounter (Signed)
 Who is your primary care physician: Dr.Rinka Phawani  Reasons for the colonoscopy: screening  Have you had a colonoscopy before?  Yes 08-29-23 Allison Finn  Do you have family history of colon cancer? no  Previous colonoscopy with polyps removed? no  Do you have a history colorectal cancer?   no  Are you diabetic? If yes, Type 1 or Type 2?    no  Do you have a prosthetic or mechanical heart valve? no  Do you have a pacemaker/defibrillator?   no  Have you had endocarditis/atrial fibrillation? no  Have you had joint replacement within the last 12 months?  no  Do you tend to be constipated or have to use laxatives? no  Do you have any history of drugs or alchohol?  Yes 12-15 drinks weekly  Do you use supplemental oxygen?  no  Have you had a stroke or heart attack within the last 6 months? no  Do you take weight loss medication?  no  For female patients: have you had a hysterectomy?  no                                     are you post menopausal?       yes                                            do you still have your menstrual cycle? no      Do you take any blood-thinning medications such as: (aspirin, warfarin, Plavix, Aggrenox)  no  If yes we need the name, milligram, dosage and who is prescribing doctor  Current Outpatient Medications on File Prior to Visit  Medication Sig Dispense Refill   amLODipine  (NORVASC ) 5 MG tablet Take 1 tablet (5 mg total) by mouth daily. 90 tablet 3   atorvastatin  (LIPITOR) 10 MG tablet Take 1 tablet (10 mg total) by mouth daily. 90 tablet 3   hydrochlorothiazide  (HYDRODIURIL ) 12.5 MG tablet Take 1 tablet (12.5 mg total) by mouth every morning. 90 tablet 3   losartan  (COZAAR ) 100 MG tablet Take 1 tablet (100 mg total) by mouth daily. 90 tablet 1   Multiple Vitamins-Minerals (AIRBORNE PO) Take by mouth 2 (two) times daily.     Multiple Vitamins-Minerals (MULTIVITAMIN PO) Take 1 tablet by mouth daily.     Omega-3 Fatty Acids (FISH OIL) 1000  MG CAPS Take 1 capsule by mouth daily.     pantoprazole  (PROTONIX ) 40 MG tablet Take 1 tablet (40 mg total) by mouth daily. 90 tablet 3   Turmeric (QC TUMERIC COMPLEX PO) Take by mouth daily.     Vitamin D , Ergocalciferol , (DRISDOL) 1.25 MG (50000 UNIT) CAPS capsule Take by mouth.     Zoster Vaccine Adjuvanted (SHINGRIX ) injection To be administered by Pharmacist 0.5 mL 1   No current facility-administered medications on file prior to visit.    Allergies  Allergen Reactions   Benazepril Other (See Comments)   Poison Ivy Extract    Polyethylene Glycol Dermatitis   Strawberry Extract Hives    Other Reaction(s): severe hives     Pharmacy: [employer]  Primary Insurance Name: [id number]  Best number where you can be reached: [phone]

## 2024-10-02 ENCOUNTER — Other Ambulatory Visit: Payer: Self-pay | Admitting: *Deleted

## 2024-10-02 ENCOUNTER — Encounter: Payer: Self-pay | Admitting: *Deleted

## 2024-10-02 ENCOUNTER — Other Ambulatory Visit (HOSPITAL_COMMUNITY): Payer: Self-pay

## 2024-10-02 MED ORDER — NA SULFATE-K SULFATE-MG SULF 17.5-3.13-1.6 GM/177ML PO SOLN
ORAL | 0 refills | Status: DC
  Filled 2024-10-02: qty 354, 1d supply, fill #0

## 2024-10-02 NOTE — Telephone Encounter (Signed)
 Pt has been scheduled for 11/09/24. Instructions mailed and prep sent to pharmacy.

## 2024-10-02 NOTE — Telephone Encounter (Signed)
 Any room Thanks

## 2024-10-08 ENCOUNTER — Other Ambulatory Visit (HOSPITAL_COMMUNITY): Payer: Self-pay

## 2024-10-08 ENCOUNTER — Other Ambulatory Visit: Payer: Self-pay

## 2024-10-08 ENCOUNTER — Encounter (INDEPENDENT_AMBULATORY_CARE_PROVIDER_SITE_OTHER): Payer: Self-pay | Admitting: *Deleted

## 2024-10-08 MED ORDER — ATORVASTATIN CALCIUM 20 MG PO TABS
20.0000 mg | ORAL_TABLET | Freq: Every day | ORAL | 1 refills | Status: AC
  Filled 2024-10-08: qty 90, 90d supply, fill #0

## 2024-10-08 NOTE — Telephone Encounter (Signed)
 Referral completed, TCS apt letter sent to PCP

## 2024-11-08 NOTE — Anesthesia Preprocedure Evaluation (Addendum)
 Anesthesia Evaluation  Patient identified by MRN, date of birth, ID band Patient awake    Reviewed: Allergy & Precautions, H&P , NPO status , Patient's Chart, lab work & pertinent test results, reviewed documented beta blocker date and time   Airway Mallampati: II  TM Distance: >3 FB Neck ROM: full    Dental no notable dental hx. (+) Dental Advisory Given, Teeth Intact   Pulmonary former smoker   Pulmonary exam normal breath sounds clear to auscultation       Cardiovascular hypertension, Normal cardiovascular exam Rhythm:regular Rate:Normal  History chest pain  ? Work up   Neuro/Psych negative neurological ROS  negative psych ROS   GI/Hepatic Neg liver ROS,GERD  ,,  Endo/Other    Class 3 obesityprediabetes  Renal/GU negative Renal ROS  negative genitourinary   Musculoskeletal   Abdominal  (+) + obese  Peds  Hematology  (+) Blood dyscrasia, anemia Hgb 10   Anesthesia Other Findings   Reproductive/Obstetrics negative OB ROS                              Anesthesia Physical Anesthesia Plan  ASA: 3  Anesthesia Plan: General   Post-op Pain Management: Minimal or no pain anticipated   Induction: Intravenous  PONV Risk Score and Plan: Propofol  infusion  Airway Management Planned: Natural Airway and Nasal Cannula  Additional Equipment: None  Intra-op Plan:   Post-operative Plan:   Informed Consent: I have reviewed the patients History and Physical, chart, labs and discussed the procedure including the risks, benefits and alternatives for the proposed anesthesia with the patient or authorized representative who has indicated his/her understanding and acceptance.     Dental Advisory Given  Plan Discussed with: CRNA  Anesthesia Plan Comments:          Anesthesia Quick Evaluation

## 2024-11-09 ENCOUNTER — Encounter (HOSPITAL_COMMUNITY): Admission: RE | Disposition: A | Payer: Self-pay | Source: Home / Self Care | Attending: Gastroenterology

## 2024-11-09 ENCOUNTER — Ambulatory Visit (HOSPITAL_COMMUNITY)
Admission: RE | Admit: 2024-11-09 | Discharge: 2024-11-09 | Disposition: A | Discharge status: Home / Self Care | Attending: Gastroenterology | Admitting: Gastroenterology

## 2024-11-09 ENCOUNTER — Encounter (HOSPITAL_COMMUNITY): Payer: Self-pay | Admitting: Gastroenterology

## 2024-11-09 ENCOUNTER — Encounter (HOSPITAL_COMMUNITY): Payer: Self-pay | Admitting: Certified Registered Nurse Anesthetist

## 2024-11-09 ENCOUNTER — Ambulatory Visit (HOSPITAL_BASED_OUTPATIENT_CLINIC_OR_DEPARTMENT_OTHER): Payer: Self-pay | Admitting: Certified Registered Nurse Anesthetist

## 2024-11-09 ENCOUNTER — Other Ambulatory Visit: Payer: Self-pay

## 2024-11-09 DIAGNOSIS — E66813 Obesity, class 3: Secondary | ICD-10-CM | POA: Diagnosis not present

## 2024-11-09 DIAGNOSIS — I10 Essential (primary) hypertension: Secondary | ICD-10-CM

## 2024-11-09 DIAGNOSIS — K589 Irritable bowel syndrome without diarrhea: Secondary | ICD-10-CM | POA: Diagnosis not present

## 2024-11-09 DIAGNOSIS — Z1211 Encounter for screening for malignant neoplasm of colon: Secondary | ICD-10-CM | POA: Diagnosis not present

## 2024-11-09 DIAGNOSIS — D509 Iron deficiency anemia, unspecified: Secondary | ICD-10-CM | POA: Diagnosis not present

## 2024-11-09 DIAGNOSIS — K648 Other hemorrhoids: Secondary | ICD-10-CM | POA: Insufficient documentation

## 2024-11-09 DIAGNOSIS — K219 Gastro-esophageal reflux disease without esophagitis: Secondary | ICD-10-CM | POA: Insufficient documentation

## 2024-11-09 DIAGNOSIS — Z833 Family history of diabetes mellitus: Secondary | ICD-10-CM | POA: Insufficient documentation

## 2024-11-09 DIAGNOSIS — Z87891 Personal history of nicotine dependence: Secondary | ICD-10-CM | POA: Diagnosis not present

## 2024-11-09 DIAGNOSIS — Z83438 Family history of other disorder of lipoprotein metabolism and other lipidemia: Secondary | ICD-10-CM | POA: Insufficient documentation

## 2024-11-09 DIAGNOSIS — Z6841 Body Mass Index (BMI) 40.0 and over, adult: Secondary | ICD-10-CM | POA: Insufficient documentation

## 2024-11-09 DIAGNOSIS — Z8249 Family history of ischemic heart disease and other diseases of the circulatory system: Secondary | ICD-10-CM | POA: Diagnosis not present

## 2024-11-09 DIAGNOSIS — R7303 Prediabetes: Secondary | ICD-10-CM | POA: Insufficient documentation

## 2024-11-09 DIAGNOSIS — E785 Hyperlipidemia, unspecified: Secondary | ICD-10-CM | POA: Insufficient documentation

## 2024-11-09 HISTORY — PX: COLONOSCOPY: SHX5424

## 2024-11-09 LAB — HM COLONOSCOPY

## 2024-11-09 SURGERY — COLONOSCOPY
Anesthesia: General

## 2024-11-09 MED ORDER — PROPOFOL 10 MG/ML IV BOLUS
INTRAVENOUS | Status: AC
  Filled 2024-11-09: qty 40

## 2024-11-09 MED ORDER — PROPOFOL 500 MG/50ML IV EMUL
INTRAVENOUS | Status: DC | PRN
  Administered 2024-11-09: 150 ug/kg/min via INTRAVENOUS
  Administered 2024-11-09: 60 mg via INTRAVENOUS

## 2024-11-09 MED ORDER — LACTATED RINGERS IV SOLN: INTRAVENOUS | Status: DC

## 2024-11-09 NOTE — Transfer of Care (Signed)
 Immediate Anesthesia Transfer of Care Note  Patient: Allison Boyle  Procedure(s) Performed: COLONOSCOPY  Patient Location: Endoscopy Unit  Anesthesia Type:General  Level of Consciousness: awake, alert , and oriented  Airway & Oxygen Therapy: Patient Spontanous Breathing  Post-op Assessment: Report given to RN, Post -op Vital signs reviewed and stable, Patient moving all extremities X 4, and Patient able to stick tongue midline  Post vital signs: Reviewed and stable  Last Vitals:  Vitals Value Taken Time  BP 131/86 11/09/24 07:53  Temp 36.8 C 11/09/24 07:53  Pulse 73 11/09/24 07:53  Resp 20 11/09/24 07:53  SpO2 94 % 11/09/24 07:53    Last Pain:  Vitals:   11/09/24 0753  TempSrc: Oral  PainSc: 0-No pain      Patients Stated Pain Goal: 7 (11/09/24 0753)  Complications: No notable events documented.

## 2024-11-09 NOTE — Discharge Instructions (Addendum)
 You are being discharged to home.  Resume your previous diet.  Your physician has recommended a repeat colonoscopy in 10 years for screening purposes.

## 2024-11-09 NOTE — Op Note (Signed)
 [facility] Patient Name: Allison Boyle Procedure Date: 11/09/2024 7:02 AM MRN: [id number] Date of Birth: May 02, 1966 Attending MD: Toribio Fortune , , [id number] CSN: [id number] Age: 58 Admit Type: Outpatient Procedure:                Colonoscopy Indications:              Screening for colorectal malignant neoplasm Providers:                Toribio Fortune, Olam Ada, RN, Dorcas Lenis,                            Technician Referring MD:              Medicines:                Monitored Anesthesia Care Complications:            No immediate complications. Estimated Blood Loss:     Estimated blood loss: none. Procedure:                Pre-Anesthesia Assessment:                           - Prior to the procedure, a History and Physical                            was performed, and patient medications, allergies                            and sensitivities were reviewed. The patient's                            tolerance of previous anesthesia was reviewed.                           - The risks and benefits of the procedure and the                            sedation options and risks were discussed with the                            patient. All questions were answered and informed                            consent was obtained.                           - ASA Grade Assessment: II - A patient with mild                            systemic disease.                           After obtaining informed consent, the colonoscope                            was passed under direct vision. Throughout the  procedure, the patient's blood pressure, pulse, and                            oxygen saturations were monitored continuously. The                            PCF-[id number] ([id number]) Peds Colon was introduced                            through the anus and advanced to the the cecum,                            identified by appendiceal orifice and ileocecal                             valve. The colonoscopy was performed without                            difficulty. The patient tolerated the procedure                            well. The quality of the bowel preparation was                            excellent. Scope In: 7:37:21 AM Scope Out: 7:47:03 AM Scope Withdrawal Time: 0 hours 7 minutes 21 seconds  Total Procedure Duration: 0 hours 9 minutes 42 seconds  Findings:      The perianal and digital rectal examinations were normal.      The colon (entire examined portion) appeared normal.      Non-bleeding internal hemorrhoids were found during retroflexion. The       hemorrhoids were small. Impression:               - The entire examined colon is normal.                           - Non-bleeding internal hemorrhoids.                           - No specimens collected. Moderate Sedation:      Per Anesthesia Care Recommendation:           - Discharge patient to home (ambulatory).                           - Resume previous diet.                           - Repeat colonoscopy in 10 years for screening                            purposes. Procedure Code(s):        --- Professional ---                           H9878, Colorectal cancer screening; colonoscopy on  individual not meeting criteria for high risk Diagnosis Code(s):        --- Professional ---                           Z12.11, Encounter for screening for malignant                            neoplasm of colon                           K64.8, Other hemorrhoids CPT copyright 2022 American Medical Association. All rights reserved. The codes documented in this report are preliminary and upon coder review may  be revised to meet current compliance requirements. Toribio Fortune, MD Toribio Fortune,  11/09/2024 7:51:17 AM This report has been signed electronically. Number of Addenda: 0

## 2024-11-09 NOTE — Anesthesia Postprocedure Evaluation (Signed)
 Anesthesia Post Note  Patient: Allison Boyle  Procedure(s) Performed: COLONOSCOPY  Patient location during evaluation: Endoscopy Anesthesia Type: General Level of consciousness: awake and alert Pain management: pain level controlled Vital Signs Assessment: post-procedure vital signs reviewed and stable Respiratory status: spontaneous breathing, nonlabored ventilation and respiratory function stable Cardiovascular status: stable Anesthetic complications: no   There were no known notable events for this encounter.   Last Vitals:  Vitals:   11/09/24 0652 11/09/24 0753  BP: (!) 141/89 131/86  Pulse: 70 73  Resp: 20 20  Temp: 36.7 C 36.8 C  SpO2: 95% 94%    Last Pain:  Vitals:   11/09/24 0753  TempSrc: Oral  PainSc: 0-No pain                 [name] [name]

## 2024-11-09 NOTE — H&P (Signed)
 Allison Boyle is an 58 y.o. female.   Chief Complaint: CRC screening HPI: 58 y/o with PMH GERD, HLD, HTN, IBS, iron deficiency anemia, coming for screening colonoscopy. Last colonoscopy was 10 years ago, which was normal - performed by Dr. Donnald.  The patient denies having any complaints such as melena, hematochezia, abdominal pain or distention, change in her bowel movement consistency or frequency, no changes in weight recently.  No family history of colorectal cancer.  Past Medical History:  Diagnosis Date   Allergic rhinitis 09/24/2024   Anemia    Back pain    Constipation    Elevated fasting glucose 09/24/2024   Food allergy    GERD (gastroesophageal reflux disease)    High cholesterol    Hypertension    IBS (irritable bowel syndrome)    Iron deficiency anemia 09/24/2024   Joint pain    Menopause present 09/24/2024   Menorrhagia 09/24/2024   Obesity    Postmenopausal bleeding 09/24/2024   Seasonal allergies     Past Surgical History:  Procedure Laterality Date   COLONOSCOPY     DILITATION & CURRETTAGE/HYSTROSCOPY WITH HYDROTHERMAL ABLATION N/A 04/17/2015   Procedure: DILATATION & CURETTAGE/HYSTEROSCOPY WITH HYDROTHERMAL ABLATION;  Surgeon: Jennifer Ozan, DO;  Location: [facility];  Service: Gynecology;  Laterality: N/A;   ESOPHAGOGASTRODUODENOSCOPY     WISDOM TOOTH EXTRACTION     bottom only    Family History  Problem Relation Age of Onset   High blood pressure Mother    Thyroid  disease Mother    Diabetes Father    High blood pressure Father    High Cholesterol Father    Heart disease Father    Stroke Father    Social History:  reports that she quit smoking about 20 years ago. Her smoking use included cigarettes. She started smoking about 45 years ago. She has a 12.5 pack-year smoking history. She has never used smokeless tobacco. She reports current alcohol use of about 8.0 standard drinks of alcohol per week. She reports that she does not use drugs.  Allergies:   Allergies  Allergen Reactions   Benazepril Other (See Comments)   Poison Ivy Extract    Polyethylene Glycol (Macrogol) Dermatitis   Strawberry Extract Hives    Other Reaction(s): severe hives    Medications Prior to Admission  Medication Sig Dispense Refill   amLODipine  (NORVASC ) 5 MG tablet Take 1 tablet (5 mg total) by mouth daily. 90 tablet 3   atorvastatin  (LIPITOR) 20 MG tablet Take 1 tablet (20 mg total) by mouth daily. 90 tablet 1   hydrochlorothiazide  (HYDRODIURIL ) 12.5 MG tablet Take 1 tablet (12.5 mg total) by mouth every morning. 90 tablet 3   losartan  (COZAAR ) 100 MG tablet Take 1 tablet (100 mg total) by mouth daily. 90 tablet 1   Multiple Vitamins-Minerals (AIRBORNE PO) Take by mouth 2 (two) times daily.     Multiple Vitamins-Minerals (MULTIVITAMIN PO) Take 1 tablet by mouth daily.     Na Sulfate-K Sulfate-Mg Sulfate concentrate (SUPREP) 17.5-3.13-1.6 GM/177ML SOLN As directed 354 mL 0   Omega-3 Fatty Acids (FISH OIL) 1000 MG CAPS Take 1 capsule by mouth daily.     pantoprazole  (PROTONIX ) 40 MG tablet Take 1 tablet (40 mg total) by mouth daily. 90 tablet 3   Turmeric (QC TUMERIC COMPLEX PO) Take by mouth daily.     Vitamin D , Ergocalciferol , (DRISDOL) 1.25 MG (50000 UNIT) CAPS capsule Take by mouth.     atorvastatin  (LIPITOR) 10 MG tablet Take 1 tablet (  10 mg total) by mouth daily. 90 tablet 3   Zoster Vaccine Adjuvanted (SHINGRIX ) injection To be administered by Pharmacist 0.5 mL 1    No results found for this or any previous visit (from the past 48 hours). No results found.  Review of Systems  All other systems reviewed and are negative.   Blood pressure (!) 141/89, pulse 70, temperature 98 F (36.7 C), temperature source Oral, resp. rate 20, height 5' 5 (1.651 m), weight 111.1 kg, last menstrual period 04/05/2015, SpO2 95%. Physical Exam  GENERAL: The patient is AO x3, in no acute distress. HEENT: Head is normocephalic and atraumatic. EOMI are intact. Mouth is  well hydrated and without lesions. NECK: Supple. No masses LUNGS: Clear to auscultation. No presence of rhonchi/wheezing/rales. Adequate chest expansion HEART: RRR, normal s1 and s2. ABDOMEN: Soft, nontender, no guarding, no peritoneal signs, and nondistended. BS +. No masses. EXTREMITIES: Without any cyanosis, clubbing, rash, lesions or edema. NEUROLOGIC: AOx3, no focal motor deficit. SKIN: no jaundice, no rashes  Assessment/Plan 58 y/o with PMH GERD, HLD, HTN, IBS, iron deficiency anemia, coming for screening colonoscopy. The patient is at average risk for colorectal cancer.  We will proceed with colonoscopy today.  Toribio Eartha Flavors, MD 11/09/2024, 7:21 AM

## 2024-11-12 ENCOUNTER — Encounter (INDEPENDENT_AMBULATORY_CARE_PROVIDER_SITE_OTHER): Payer: Self-pay | Admitting: *Deleted

## 2024-11-13 ENCOUNTER — Encounter (HOSPITAL_COMMUNITY): Payer: Self-pay | Admitting: Gastroenterology

## 2024-11-21 DIAGNOSIS — E7800 Pure hypercholesterolemia, unspecified: Secondary | ICD-10-CM | POA: Diagnosis not present

## 2024-11-22 ENCOUNTER — Other Ambulatory Visit (HOSPITAL_COMMUNITY): Payer: Self-pay

## 2024-11-22 MED ORDER — ATORVASTATIN CALCIUM 40 MG PO TABS
40.0000 mg | ORAL_TABLET | Freq: Every day | ORAL | 0 refills | Status: AC
  Filled 2024-11-22: qty 90, 90d supply, fill #0

## 2024-11-26 ENCOUNTER — Other Ambulatory Visit (HOSPITAL_COMMUNITY): Payer: Self-pay

## 2025-01-07 ENCOUNTER — Other Ambulatory Visit: Payer: Self-pay

## 2025-01-07 ENCOUNTER — Other Ambulatory Visit (HOSPITAL_COMMUNITY): Payer: Self-pay

## 2025-01-08 ENCOUNTER — Encounter: Payer: Self-pay | Admitting: Pharmacist

## 2025-01-08 ENCOUNTER — Other Ambulatory Visit: Payer: Self-pay

## 2025-01-08 ENCOUNTER — Other Ambulatory Visit (HOSPITAL_COMMUNITY): Payer: Self-pay

## 2025-01-09 ENCOUNTER — Other Ambulatory Visit: Payer: Self-pay
